# Patient Record
Sex: Female | Born: 1995
Health system: Southern US, Community
[De-identification: ages and names within clinical notes are randomized; demographics above are authoritative.]

## PROBLEM LIST (undated history)

## (undated) ENCOUNTER — Emergency Department (HOSPITAL_COMMUNITY): Admission: EM | Payer: 59

## (undated) DIAGNOSIS — K219 Gastro-esophageal reflux disease without esophagitis: Secondary | ICD-10-CM

## (undated) DIAGNOSIS — F419 Anxiety disorder, unspecified: Secondary | ICD-10-CM

## (undated) DIAGNOSIS — R7303 Prediabetes: Secondary | ICD-10-CM

## (undated) DIAGNOSIS — T7840XA Allergy, unspecified, initial encounter: Secondary | ICD-10-CM

## (undated) HISTORY — DX: Allergy, unspecified, initial encounter: T78.40XA

## (undated) HISTORY — PX: TONSILLECTOMY AND ADENOIDECTOMY: SHX28

## (undated) HISTORY — DX: Anxiety disorder, unspecified: F41.9

## (undated) HISTORY — PX: WISDOM TOOTH EXTRACTION: SHX21

---
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
## 2013-03-30 ENCOUNTER — Ambulatory Visit (INDEPENDENT_AMBULATORY_CARE_PROVIDER_SITE_OTHER): Payer: BC Managed Care – PPO | Admitting: *Deleted

## 2013-03-30 DIAGNOSIS — Z111 Encounter for screening for respiratory tuberculosis: Secondary | ICD-10-CM

## 2013-03-30 NOTE — Progress Notes (Signed)
Patient tolerated well.

## 2013-03-30 NOTE — Patient Instructions (Signed)
TB (Tuberculosis) Tine Test  The tuberculin tine test is used to determine whether someone has come in contact with the bacteria that cause a disease called tuberculosis.  HOW THE TEST IS DONE      This test uses a tiny spiked instrument to inject a small amount of the tuberculosis dead protein material (antigen) just under your skin. This is most commonly done on the arm. Usually, the area is marked with an ink pen. That way it can be checked for any redness and swelling. It is usually checked in 2 to 3 days.  Note: Another test, called the "tuberculin skin test" (also called a PPD), is more accurate than the TB tine test. It is the preferred method of determining exposure to tuberculosis.  PREPARATION FOR TEST      There is no special preparation. People with a skin rash or other skin irritations on their arms may need to have the test performed at a different spot on the body.  HOW THE TEST WILL FEEL      Some people feel a slight stinging sensation when the instrument is inserted under the skin. After the test, the area may itch or burn.  MEANING OF THE TEST      This test helps determine if you have ever been exposed to a disease called tuberculosis. If so, your immune system produced antibodies to help fight the disease. These remain in your body. When this test is performed, those with antibodies to tuberculosis will have a positive test result.  OBTAINING THE TEST RESULTS  The area needs to be checked for any redness and swelling at a later time, usually in 2 to 3 days. Follow your caregiver's instructions as to where and when to report for this to be done. It is your responsibility to obtain your test results. Ask the lab or department performing the test when and how you will get your results.  NORMAL FINDINGS     If you have a negative test result, the area may be a little red, but will not be swollen and firm like a mosquito bite. This means you have not been exposed to the bacteria that cause  tuberculosis.  WHAT ABNORMAL RESULTS MEAN      If you have been exposed to tuberculosis, the area will become red and swell like a mosquito bit in 48 to 72 hours. This is considered a positive test result. It means your body's immune system detected the substance injected under your skin. A positive TB tine test does not mean that you have active tuberculosis. It only means that you have been exposed at some point in the past.   A chest x-ray may be taken to evaluate whether you have active tuberculosis.  Once you have been exposed, all future TB tine tests will be positive. If you have a positive TB tine test, the Centers for Disease Control and Prevention (CDC) recommends that you have a TB skin test (PPD, see above), unless the first tine test had a blistering or very large reaction.  RISKS AND COMPLICATIONS     The risk of severe side effects is very low. In the unlikely event that a side effect occurs, typical ones include itching and hives. Rarely, the area may blister, or the area of swelling may become very large.  Tell your health care provider if you have any severe reactions.  CONSIDERATIONS      The test results may be incorrect (false negative). False negative means the test suggests you have not been exposed to tuberculosis, but you really have been.  This is more likely in the elderly   and in patients with weakened immune systems, such as:  · AIDS patients  · Cancer patients who receive chemotherapy  · Those who receive organ transplants  · Anyone taking high doses of prescription steroid medication  Document Released: 12/20/2006 Document Revised: 09/27/2011 Document Reviewed: 01/16/2007  ExitCare® Patient Information ©2014 [employer].

## 2013-04-02 ENCOUNTER — Encounter: Payer: Self-pay | Admitting: *Deleted

## 2013-04-02 LAB — TB SKIN TEST
Induration: 0 mm
TB Skin Test: NEGATIVE

## 2013-05-17 ENCOUNTER — Ambulatory Visit: Payer: BC Managed Care – PPO | Admitting: Family Medicine

## 2013-05-24 ENCOUNTER — Ambulatory Visit (INDEPENDENT_AMBULATORY_CARE_PROVIDER_SITE_OTHER): Payer: BC Managed Care – PPO | Admitting: Family Medicine

## 2013-05-24 ENCOUNTER — Encounter: Payer: Self-pay | Admitting: Family Medicine

## 2013-05-24 VITALS — BP 120/78 | HR 74 | Temp 99.7°F | Ht 63.0 in | Wt 202.2 lb

## 2013-05-24 DIAGNOSIS — IMO0001 Reserved for inherently not codable concepts without codable children: Secondary | ICD-10-CM

## 2013-05-24 DIAGNOSIS — J069 Acute upper respiratory infection, unspecified: Secondary | ICD-10-CM

## 2013-05-24 DIAGNOSIS — Z309 Encounter for contraceptive management, unspecified: Secondary | ICD-10-CM

## 2013-05-24 MED ORDER — NORGESTIM-ETH ESTRAD TRIPHASIC 0.18/0.215/0.25 MG-25 MCG PO TABS: 1.0000 | ORAL_TABLET | Freq: Every day | ORAL | Status: DC

## 2013-05-24 MED ORDER — AZITHROMYCIN 250 MG PO TABS: ORAL_TABLET | ORAL | Status: DC

## 2013-05-24 NOTE — Progress Notes (Signed)
  Subjective:    Patient ID: Jennet Maduro, female    DOB: [birth year], 17 y.o.   MRN: [id number]  HPI This 17 y.o. female presents for evaluation of wanting to get on oral bcp's and URI Sx's for over a week.   Review of Systems C/o Uri sx's for over a week.   No chest pain, SOB, HA, dizziness, vision change, N/V, diarrhea, constipation, dysuria, urinary urgency or frequency, myalgias, arthralgias or rash.  Objective:   Physical Exam Vital signs noted  Well developed well nourished female.  HEENT - Head atraumatic Normocephalic                Eyes - PERRLA, Conjuctiva - clear Sclera- Clear EOMI                Ears - EAC's Wnl TM's Wnl Gross Hearing WNL                Nose - Nares patent                 Throat - oropharanx wnl Respiratory - Lungs CTA bilateral Cardiac - RRR S1 and S2 without murmur GI - Abdomen soft Nontender and bowel sounds active x 4 Extremities - No edema. Neuro - Grossly intact.       Assessment & Plan:  Contraception - Plan: Norgestimate-Ethinyl Estradiol Triphasic (ORTHO TRI-CYCLEN LO) 0.18/0.215/0.25 MG-25 MCG tab  URI (upper respiratory infection) - Plan: azithromycin (ZITHROMAX) 250 MG tablet  Deatra Canter FNP

## 2013-05-24 NOTE — Patient Instructions (Signed)

## 2013-07-09 ENCOUNTER — Telehealth: Payer: Self-pay | Admitting: Family Medicine

## 2013-07-09 NOTE — Telephone Encounter (Signed)
Appt given for tomorrow

## 2013-07-10 ENCOUNTER — Encounter: Payer: Self-pay | Admitting: Nurse Practitioner

## 2013-07-10 ENCOUNTER — Ambulatory Visit (INDEPENDENT_AMBULATORY_CARE_PROVIDER_SITE_OTHER): Payer: BC Managed Care – PPO | Admitting: Nurse Practitioner

## 2013-07-10 VITALS — BP 131/86 | Temp 99.1°F | Ht 63.0 in | Wt 195.0 lb

## 2013-07-10 DIAGNOSIS — R509 Fever, unspecified: Secondary | ICD-10-CM

## 2013-07-10 DIAGNOSIS — J069 Acute upper respiratory infection, unspecified: Secondary | ICD-10-CM

## 2013-07-10 LAB — POCT INFLUENZA A/B: Influenza A, POC: NEGATIVE

## 2013-07-10 NOTE — Patient Instructions (Signed)

## 2013-07-10 NOTE — Progress Notes (Signed)
   Subjective:    Patient ID: Sonya Santiago, female    DOB: 1995/12/08, 17 y.o.   MRN: [id number]  HPI  Patient in c/o cough, achiness and fever that started last Sunday- Feeling a little better today taking tylenol OTC.    Review of Systems  Constitutional: Positive for fever and chills. Negative for appetite change.  HENT: Positive for congestion, rhinorrhea and sore throat. Negative for sinus pressure.   Respiratory: Positive for cough.   Cardiovascular: Negative.   Gastrointestinal: Negative.   Genitourinary: Negative.   Musculoskeletal: Negative.        Objective:   Physical Exam  Constitutional: She is oriented to person, place, and time. She appears well-developed and well-nourished.  HENT:  Right Ear: Hearing, tympanic membrane, external ear and ear canal normal.  Left Ear: Hearing, tympanic membrane, external ear and ear canal normal.  Nose: Mucosal edema and rhinorrhea present. Right sinus exhibits no maxillary sinus tenderness and no frontal sinus tenderness. Left sinus exhibits no maxillary sinus tenderness and no frontal sinus tenderness.  Mouth/Throat: Uvula is midline. Posterior oropharyngeal edema (mild) present.  Cardiovascular: Normal rate and normal heart sounds.   Pulmonary/Chest: Effort normal and breath sounds normal.  Neurological: She is alert and oriented to person, place, and time.  Psychiatric: She has a normal mood and affect. Her behavior is normal. Judgment and thought content normal.    BP 131/86  Temp(Src) 99.1 F (37.3 C) (Oral)  Ht 5\' 3"  (1.6 m)  Wt 195 lb (88.451 kg)  BMI 34.55 kg/m2 Results for orders placed in visit on 07/10/13  POCT INFLUENZA A/B      Result Value Range   Influenza A, POC Negative     Influenza B, POC Negative           Assessment & Plan:   1. Fever, unspecified   2. Viral URI    1. Take meds as prescribed 2. Use a cool mist humidifier especially during the winter months and when heat has  been humid. 3.  Use saline nose sprays frequently 4. Saline irrigations of the nose can be very helpful if done frequently.  * 4X daily for 1 week*  * Use of a nettie pot can be helpful with this. Follow directions with this* 5. Drink plenty of fluids 6. Keep thermostat turn down low 7.For any cough or congestion  Use plain Mucinex- regular strength or max strength is fine   * Children- consult with Pharmacist for dosing 8. For fever or aces or pains- take tylenol or ibuprofen appropriate for age and weight.  * for fevers greater than 101 orally you may alternate ibuprofen and tylenol every  3 hours.   Mary-Margaret Daphine Deutscher, FNP

## 2013-07-25 ENCOUNTER — Ambulatory Visit (INDEPENDENT_AMBULATORY_CARE_PROVIDER_SITE_OTHER): Payer: BC Managed Care – PPO | Admitting: Family Medicine

## 2013-07-25 ENCOUNTER — Encounter: Payer: Self-pay | Admitting: Family Medicine

## 2013-07-25 VITALS — BP 126/84 | HR 100 | Temp 98.7°F | Ht 63.0 in | Wt 193.4 lb

## 2013-07-25 DIAGNOSIS — F329 Major depressive disorder, single episode, unspecified: Secondary | ICD-10-CM

## 2013-07-25 DIAGNOSIS — F3289 Other specified depressive episodes: Secondary | ICD-10-CM

## 2013-07-25 DIAGNOSIS — F32A Depression, unspecified: Secondary | ICD-10-CM

## 2013-07-25 DIAGNOSIS — R5383 Other fatigue: Principal | ICD-10-CM

## 2013-07-25 DIAGNOSIS — R5381 Other malaise: Secondary | ICD-10-CM

## 2013-07-25 LAB — POCT CBC
Granulocyte percent: 70.6 %G (ref 37–80)
HCT, POC: 40.4 % (ref 37.7–47.9)
Hemoglobin: 12.7 g/dL (ref 12.2–16.2)
Lymph, poc: 2.2 (ref 0.6–3.4)
MCH, POC: 25.7 pg — AB (ref 27–31.2)
MCHC: 31.5 g/dL — AB (ref 31.8–35.4)
MCV: 81.5 fL (ref 80–97)
MPV: 8.2 fL (ref 0–99.8)
POC Granulocyte: 6.1 (ref 2–6.9)
POC LYMPH PERCENT: 24.9 %L (ref 10–50)
Platelet Count, POC: 330 10*3/uL (ref 142–424)
RBC: 5 M/uL (ref 4.04–5.48)
RDW, POC: 14.5 %
WBC: 8.7 10*3/uL (ref 4.6–10.2)

## 2013-07-25 MED ORDER — BUPROPION HCL ER (XL) 150 MG PO TB24: 150.0000 mg | ORAL_TABLET | Freq: Every day | ORAL | Status: DC

## 2013-07-25 NOTE — Progress Notes (Signed)
   Subjective:    Patient ID: Sonya Santiago, female    DOB: September 07, 1995, 18 y.o.   MRN: [id number]  HPI This 18 y.o. female presents for evaluation of anorexia and low appetite for 3-4 months. She states she has nausea and early satiety when she eats now.  She has been having Low energy.  She has been having depression problems.   Review of Systems No chest pain, SOB, HA, dizziness, vision change, N/V, diarrhea, constipation, dysuria, urinary urgency or frequency, myalgias, arthralgias or rash.     Objective:   Physical Exam  Vital signs noted  Well developed well nourished female.  HEENT - Head atraumatic Normocephalic                Eyes - PERRLA, Conjuctiva - clear Sclera- Clear EOMI                Ears - EAC's Wnl TM's Wnl Gross Hearing                 Throat - oropharanx wnl Respiratory - Lungs CTA bilateral Cardiac - RRR S1 and S2 without murmur GI - Abdomen soft Nontender and bowel sounds active x 4 Extremities - No edema. Neuro - Grossly intact. Psych - S (+) I (+) E (+) G(+) C (+) A (+) P (+) S (+) appetite (+)               Denies suicide or homicidal ideation.     Assessment & Plan:  Other malaise and fatigue - Plan: POCT CBC, CMP14+EGFR, Thyroid Panel With TSH, Vitamin B12, Vit D  25 hydroxy (rtn osteoporosis monitoring)  Depression - Plan: POCT CBC, CMP14+EGFR, Thyroid Panel With TSH, Vitamin B12, Vit D  25 hydroxy (rtn osteoporosis monitoring)  Lysbeth Penner FNP

## 2013-07-25 NOTE — Patient Instructions (Signed)

## 2013-07-26 ENCOUNTER — Other Ambulatory Visit: Payer: Self-pay | Admitting: Family Medicine

## 2013-07-26 LAB — CMP14+EGFR
ALT: 12 IU/L (ref 0–32)
AST: 15 IU/L (ref 0–40)
Albumin/Globulin Ratio: 1.7 (ref 1.1–2.5)
Albumin: 4.6 g/dL (ref 3.5–5.5)
Alkaline Phosphatase: 83 IU/L (ref 39–117)
BUN/Creatinine Ratio: 11 (ref 8–20)
BUN: 8 mg/dL (ref 6–20)
CO2: 21 mmol/L (ref 18–29)
Calcium: 9.9 mg/dL (ref 8.7–10.2)
Chloride: 99 mmol/L (ref 97–108)
Creatinine, Ser: 0.7 mg/dL (ref 0.57–1.00)
GFR calc Af Amer: 145 mL/min/{1.73_m2} (ref >59)
GFR calc non Af Amer: 126 mL/min/{1.73_m2} (ref >59)
Globulin, Total: 2.7 g/dL (ref 1.5–4.5)
Glucose: 93 mg/dL (ref 65–99)
Potassium: 4.6 mmol/L (ref 3.5–5.2)
Sodium: 140 mmol/L (ref 134–144)
Total Bilirubin: 0.4 mg/dL (ref 0.0–1.2)
Total Protein: 7.3 g/dL (ref 6.0–8.5)

## 2013-07-26 LAB — VITAMIN D 25 HYDROXY (VIT D DEFICIENCY, FRACTURES): Vit D, 25-Hydroxy: 20.6 ng/mL — ABNORMAL LOW (ref 30.0–100.0)

## 2013-07-26 LAB — VITAMIN B12: Vitamin B-12: 219 pg/mL (ref 211–946)

## 2013-07-26 LAB — THYROID PANEL WITH TSH
Free Thyroxine Index: 2.1 (ref 1.2–4.9)
T3 Uptake Ratio: 26 % (ref 24–39)
T4, Total: 8.2 ug/dL (ref 4.5–12.0)
TSH: 1.01 u[IU]/mL (ref 0.450–4.500)

## 2013-07-26 MED ORDER — VITAMIN D (ERGOCALCIFEROL) 1.25 MG (50000 UNIT) PO CAPS: 50000.0000 [IU] | ORAL_CAPSULE | ORAL | Status: DC

## 2013-07-26 MED ORDER — CYANOCOBALAMIN 1000 MCG/ML IJ SOLN: INTRAMUSCULAR | Status: DC

## 2013-08-27 ENCOUNTER — Encounter: Payer: Self-pay | Admitting: Family Medicine

## 2013-08-27 ENCOUNTER — Ambulatory Visit (INDEPENDENT_AMBULATORY_CARE_PROVIDER_SITE_OTHER): Payer: BC Managed Care – PPO | Admitting: Family Medicine

## 2013-08-27 VITALS — BP 112/71 | HR 68 | Temp 97.0°F | Ht 63.0 in | Wt 193.0 lb

## 2013-08-27 DIAGNOSIS — R5381 Other malaise: Secondary | ICD-10-CM

## 2013-08-27 DIAGNOSIS — J312 Chronic pharyngitis: Secondary | ICD-10-CM

## 2013-08-27 DIAGNOSIS — R5383 Other fatigue: Secondary | ICD-10-CM

## 2013-08-27 DIAGNOSIS — R0609 Other forms of dyspnea: Secondary | ICD-10-CM

## 2013-08-27 DIAGNOSIS — Z111 Encounter for screening for respiratory tuberculosis: Secondary | ICD-10-CM

## 2013-08-27 DIAGNOSIS — R0989 Other specified symptoms and signs involving the circulatory and respiratory systems: Secondary | ICD-10-CM

## 2013-08-27 DIAGNOSIS — R0683 Snoring: Secondary | ICD-10-CM

## 2013-08-27 DIAGNOSIS — Z23 Encounter for immunization: Secondary | ICD-10-CM

## 2013-08-27 DIAGNOSIS — Z0000 Encounter for general adult medical examination without abnormal findings: Secondary | ICD-10-CM

## 2013-08-27 LAB — POCT CBC
Granulocyte percent: 75.3 %G (ref 37–80)
HCT, POC: 36.4 % — AB (ref 37.7–47.9)
Hemoglobin: 11.3 g/dL — AB (ref 12.2–16.2)
Lymph, poc: 2 (ref 0.6–3.4)
MCH, POC: 25 pg — AB (ref 27–31.2)
MCHC: 31.1 g/dL — AB (ref 31.8–35.4)
MCV: 80.6 fL (ref 80–97)
MPV: 8.1 fL (ref 0–99.8)
POC Granulocyte: 6.9 (ref 2–6.9)
POC LYMPH PERCENT: 21.8 %L (ref 10–50)
Platelet Count, POC: 340 10*3/uL (ref 142–424)
RBC: 4.5 M/uL (ref 4.04–5.48)
RDW, POC: 14 %
WBC: 9.1 10*3/uL (ref 4.6–10.2)

## 2013-08-27 MED ORDER — NORGESTIM-ETH ESTRAD TRIPHASIC 0.18/0.215/0.25 MG-35 MCG PO TABS: 1.0000 | ORAL_TABLET | Freq: Every day | ORAL | Status: DC

## 2013-08-27 NOTE — Progress Notes (Signed)
   Subjective:    Patient ID: Sonya Santiago, female    DOB: 1995/08/16, 18 y.o.   MRN: [id number]  HPI This 18 y.o. female presents for evaluation of vitamin D deficiency, b12 deficiency, and depression And it is controlled with the wellbutrin.   She needs a TBST for work.  She gets strep throat frequently and she has enlarged tonsils.  She has snoring problems and she has insomnia.  She needs her ortho tri cyclen lo changed to ortho tricyclen since the prior is too expensive.   Review of Systems No chest pain, SOB, HA, dizziness, vision change, N/V, diarrhea, constipation, dysuria, urinary urgency or frequency, myalgias, arthralgias or rash.     Objective:   Physical Exam Vital signs noted  Well developed well nourished female.  HEENT - Head atraumatic Normocephalic                Eyes - PERRLA, Conjuctiva - clear Sclera- Clear EOMI                Ears - EAC's Wnl TM's Wnl Gross Hearing WNL                Nose - Nares patent                 Throat - oropharanx tonsils 3 plus Respiratory - Lungs CTA bilateral Cardiac - RRR S1 and S2 without murmur GI - Abdomen soft Nontender and bowel sounds active x 4 Extremities - No edema. Neuro - Grossly intact.       Assessment & Plan:  Need for tuberculosis vaccination - Plan: PPD, Vit D  25 hydroxy (rtn osteoporosis monitoring)  Chronic pharyngitis - Plan: Ambulatory referral to ENT  Snoring - Plan: Ambulatory referral to ENT  Other malaise and fatigue - Plan: Vit D  25 hydroxy (rtn osteoporosis monitoring), Vitamin B12, CMP14+EGFR, Lipid panel, TSH, POCT CBC, Ambulatory referral to ENT  Routine general medical examination at a health care facility - Plan: Vit D  25 hydroxy (rtn osteoporosis monitoring), Vitamin B12, CMP14+EGFR, Lipid panel, TSH, POCT CBC, Norgestimate-Ethinyl Estradiol Triphasic (ORTHO TRI-CYCLEN, 28,) 0.18/0.215/0.25 MG-35 MCG tablet  Lysbeth Penner FNP

## 2013-08-28 ENCOUNTER — Other Ambulatory Visit: Payer: Self-pay | Admitting: Family Medicine

## 2013-08-28 LAB — CMP14+EGFR
ALT: 15 IU/L (ref 0–32)
AST: 14 IU/L (ref 0–40)
Albumin/Globulin Ratio: 2 (ref 1.1–2.5)
Albumin: 4.5 g/dL (ref 3.5–5.5)
Alkaline Phosphatase: 77 IU/L (ref 39–117)
BUN/Creatinine Ratio: 13 (ref 8–20)
BUN: 8 mg/dL (ref 6–20)
CO2: 22 mmol/L (ref 18–29)
Calcium: 9.6 mg/dL (ref 8.7–10.2)
Chloride: 100 mmol/L (ref 97–108)
Creatinine, Ser: 0.63 mg/dL (ref 0.57–1.00)
GFR calc Af Amer: 150 mL/min/{1.73_m2} (ref >59)
GFR calc non Af Amer: 130 mL/min/{1.73_m2} (ref >59)
Globulin, Total: 2.3 g/dL (ref 1.5–4.5)
Glucose: 90 mg/dL (ref 65–99)
Potassium: 4.6 mmol/L (ref 3.5–5.2)
Sodium: 139 mmol/L (ref 134–144)
Total Bilirubin: 0.2 mg/dL (ref 0.0–1.2)
Total Protein: 6.8 g/dL (ref 6.0–8.5)

## 2013-08-28 LAB — VITAMIN D 25 HYDROXY (VIT D DEFICIENCY, FRACTURES): Vit D, 25-Hydroxy: 28 ng/mL — ABNORMAL LOW (ref 30.0–100.0)

## 2013-08-28 LAB — LIPID PANEL
Chol/HDL Ratio: 3.3 ratio units (ref 0.0–4.4)
Cholesterol, Total: 162 mg/dL (ref 100–169)
HDL: 49 mg/dL (ref >39)
LDL Calculated: 96 mg/dL (ref 0–109)
Triglycerides: 84 mg/dL (ref 0–89)
VLDL Cholesterol Cal: 17 mg/dL (ref 5–40)

## 2013-08-28 LAB — TSH: TSH: 1.55 u[IU]/mL (ref 0.450–4.500)

## 2013-08-28 LAB — VITAMIN B12: Vitamin B-12: 616 pg/mL (ref 211–946)

## 2013-08-28 MED ORDER — VITAMIN D (ERGOCALCIFEROL) 1.25 MG (50000 UNIT) PO CAPS: 50000.0000 [IU] | ORAL_CAPSULE | ORAL | Status: DC

## 2013-08-29 ENCOUNTER — Telehealth: Payer: Self-pay | Admitting: Family Medicine

## 2013-08-30 NOTE — Telephone Encounter (Signed)
Message copied by Almeta MonasSTONE, Seba Madole M on Thu Aug 30, 2013  4:21 PM ------      Message from: Deatra CanterXFORD, WILLIAM J      Created: Tue Aug 28, 2013  8:25 AM       Mild anemia and recommend taking otc iron for this.  Can take in MVI also.  Vit D a little low and vit D rx sent in to pharmacy ------

## 2013-08-30 NOTE — Telephone Encounter (Signed)
Aware of results. 

## 2013-08-30 NOTE — Telephone Encounter (Signed)
Cannot leave message.

## 2013-08-30 NOTE — Telephone Encounter (Signed)
No answer. Note on labs.

## 2013-09-12 ENCOUNTER — Other Ambulatory Visit: Payer: Self-pay | Admitting: *Deleted

## 2013-09-12 MED ORDER — BUPROPION HCL ER (XL) 150 MG PO TB24: 150.0000 mg | ORAL_TABLET | Freq: Every day | ORAL | Status: DC

## 2013-10-16 ENCOUNTER — Ambulatory Visit: Payer: BC Managed Care – PPO

## 2013-11-07 ENCOUNTER — Other Ambulatory Visit: Payer: Self-pay | Admitting: Otolaryngology

## 2014-02-12 ENCOUNTER — Other Ambulatory Visit: Payer: Self-pay | Admitting: Family Medicine

## 2014-08-14 ENCOUNTER — Other Ambulatory Visit: Payer: Self-pay | Admitting: Family Medicine

## 2015-04-08 ENCOUNTER — Telehealth: Payer: Self-pay | Admitting: Family Medicine

## 2015-04-08 ENCOUNTER — Encounter: Payer: Self-pay | Admitting: Family

## 2015-04-08 ENCOUNTER — Ambulatory Visit (INDEPENDENT_AMBULATORY_CARE_PROVIDER_SITE_OTHER): Payer: BLUE CROSS/BLUE SHIELD | Admitting: Family

## 2015-04-08 VITALS — BP 137/86 | HR 107 | Temp 99.0°F | Ht 63.0 in | Wt 220.0 lb

## 2015-04-08 DIAGNOSIS — R1032 Left lower quadrant pain: Secondary | ICD-10-CM

## 2015-04-08 DIAGNOSIS — R1030 Lower abdominal pain, unspecified: Secondary | ICD-10-CM

## 2015-04-08 DIAGNOSIS — R1011 Right upper quadrant pain: Secondary | ICD-10-CM | POA: Diagnosis not present

## 2015-04-08 DIAGNOSIS — N3000 Acute cystitis without hematuria: Secondary | ICD-10-CM | POA: Diagnosis not present

## 2015-04-08 LAB — POCT URINALYSIS DIPSTICK
Bilirubin, UA: NEGATIVE
GLUCOSE UA: NEGATIVE
Ketones, UA: NEGATIVE
LEUKOCYTES UA: NEGATIVE
NITRITE UA: NEGATIVE
Protein, UA: NEGATIVE
RBC UA: NEGATIVE
Spec Grav, UA: >=1.03
UROBILINOGEN UA: NEGATIVE
pH, UA: 5

## 2015-04-08 LAB — POCT UA - MICROSCOPIC ONLY
Casts, Ur, LPF, POC: NEGATIVE
Crystals, Ur, HPF, POC: NEGATIVE
Yeast, UA: NEGATIVE

## 2015-04-08 MED ORDER — SULFAMETHOXAZOLE-TRIMETHOPRIM 800-160 MG PO TABS: 1.0000 | ORAL_TABLET | Freq: Two times a day (BID) | ORAL | Status: DC

## 2015-04-08 NOTE — Progress Notes (Signed)
   Subjective:    Patient ID: Sonya Santiago, female    DOB: 1995-07-26, 19 y.o.   MRN: [id number]  Abdominal Pain This is a new problem. The current episode started in the past 7 days. The onset quality is gradual. The problem occurs constantly. The problem has been gradually worsening. The pain is located in the periumbilical region. The pain is at a severity of 5/10. The pain is moderate. The quality of the pain is aching. Associated symptoms include a fever, headaches and nausea. Pertinent negatives include no dysuria, frequency, hematuria or vomiting. The pain is aggravated by movement. The pain is relieved by being still. She has tried acetaminophen (AZO) for the symptoms. The treatment provided mild relief.  Fever  Associated symptoms include abdominal pain, headaches and nausea. Pertinent negatives include no urinary pain or vomiting.      Review of Systems  Constitutional: Positive for fever.  Eyes: Negative.   Respiratory: Negative.  Negative for shortness of breath.   Cardiovascular: Negative.  Negative for palpitations.  Gastrointestinal: Positive for nausea and abdominal pain. Negative for vomiting.  Endocrine: Negative.   Genitourinary: Negative.  Negative for dysuria, frequency and hematuria.  Musculoskeletal: Negative.   Neurological: Positive for headaches.  Hematological: Negative.   Psychiatric/Behavioral: Negative.   All other systems reviewed and are negative.      Objective:   Physical Exam  Constitutional: She is oriented to person, place, and time. She appears well-developed and well-nourished. No distress.  HENT:  Head: Normocephalic and atraumatic.  Eyes: Pupils are equal, round, and reactive to light.  Neck: Normal range of motion. Neck supple. No thyromegaly present.  Cardiovascular: Normal rate, regular rhythm, normal heart sounds and intact distal pulses.   No murmur heard. Pulmonary/Chest: Effort normal and breath sounds normal. No respiratory  distress. She has no wheezes.  Abdominal: Soft. Bowel sounds are normal. She exhibits no distension. There is tenderness (LLQ and RUQ pain).  Musculoskeletal: Normal range of motion. She exhibits no edema or tenderness.  Negative for CVA tenderness   Neurological: She is alert and oriented to person, place, and time. She has normal reflexes. No cranial nerve deficit.  Skin: Skin is warm and dry.  Psychiatric: She has a normal mood and affect. Her behavior is normal. Judgment and thought content normal.  Vitals reviewed.     BP 137/86 mmHg  Pulse 107  Temp(Src) 100.1 F (37.8 C) (Oral)  Ht  (1.6 m)  Wt 220 lb (99.791 kg)  BMI 38.98 kg/m2  LMP  (Approximate)     Assessment & Plan:  1. Lower abdominal pain - POCT urinalysis dipstick - POCT UA - Microscopic Only - CBC with Differential/Platelet  2. Acute cystitis without hematuria -Force fluids AZO over the counter X2 days RTO prn Culture pending - sulfamethoxazole-trimethoprim (BACTRIM DS,SEPTRA DS) 800-160 MG per tablet; Take 1 tablet by mouth 2 (two) times daily.  Dispense: 10 tablet; Refill: 0 - CBC with Differential/Platelet  3. RUQ abdominal pain - CBC with Differential/Platelet  4. LLQ abdominal pain - CBC with Differential/Platelet  Pt told if abd pain increases or fever increases to RTO or go to ED Discussed abd pain and fever could be related to gallbladder or appedix- Pt to follow up in 3 days Starting pt on antibiotic for UTI and CBC pending  Jannifer Rodney, FNP

## 2015-04-08 NOTE — Telephone Encounter (Signed)
Stp and pt given appt with Jannifer Rodney today at 4:40.

## 2015-04-08 NOTE — Patient Instructions (Addendum)
Abdominal Pain °Many things can cause abdominal pain. Usually, abdominal pain is not caused by a disease and will improve without treatment. It can often be observed and treated at home. Your health care provider will do a physical exam and possibly order blood tests and X-rays to help determine the seriousness of your pain. However, in many cases, more time must pass before a clear cause of the pain can be found. Before that point, your health care provider may not know if you need more testing or further treatment. °HOME CARE INSTRUCTIONS  °Monitor your abdominal pain for any changes. The following actions may help to alleviate any discomfort you are experiencing: °· Only take over-the-counter or prescription medicines as directed by your health care provider. °· Do not take laxatives unless directed to do so by your health care provider. °· Try a clear liquid diet (broth, tea, or water) as directed by your health care provider. Slowly move to a bland diet as tolerated. °SEEK MEDICAL CARE IF: °· You have unexplained abdominal pain. °· You have abdominal pain associated with nausea or diarrhea. °· You have pain when you urinate or have a bowel movement. °· You experience abdominal pain that wakes you in the night. °· You have abdominal pain that is worsened or improved by eating food. °· You have abdominal pain that is worsened with eating fatty foods. °· You have a fever. °SEEK IMMEDIATE MEDICAL CARE IF:  °· Your pain does not go away within 2 hours. °· You keep throwing up (vomiting). °· Your pain is felt only in portions of the abdomen, such as the right side or the left lower portion of the abdomen. °· You pass bloody or black tarry stools. °MAKE SURE YOU: °· Understand these instructions.   °· Will watch your condition.   °· Will get help right away if you are not doing well or get worse.   °Document Released: 04/14/2005 Document Revised: 07/10/2013 Document Reviewed: 03/14/2013 °[employer]® Patient Information  ©2015 [employer]. This information is not intended to replace advice given to you by your health care provider. Make sure you discuss any questions you have with your health care provider. °Urinary Tract Infection °Urinary tract infections (UTIs) can develop anywhere along your urinary tract. Your urinary tract is your body's drainage system for removing wastes and extra water. Your urinary tract includes two kidneys, two ureters, a bladder, and a urethra. Your kidneys are a pair of bean-shaped organs. Each kidney is about the size of your fist. They are located below your ribs, one on each side of your spine. °CAUSES °Infections are caused by microbes, which are microscopic organisms, including fungi, viruses, and bacteria. These organisms are so small that they can only be seen through a microscope. Bacteria are the microbes that most commonly cause UTIs. °SYMPTOMS  °Symptoms of UTIs may vary by age and gender of the patient and by the location of the infection. Symptoms in young women typically include a frequent and intense urge to urinate and a painful, burning feeling in the bladder or urethra during urination. Older women and men are more likely to be tired, shaky, and weak and have muscle aches and abdominal pain. A fever may mean the infection is in your kidneys. Other symptoms of a kidney infection include pain in your back or sides below the ribs, nausea, and vomiting. °DIAGNOSIS °To diagnose a UTI, your caregiver will ask you about your symptoms. Your caregiver also will ask to provide a urine sample. The urine sample   will be tested for bacteria and white blood cells. White blood cells are made by your body to help fight infection. °TREATMENT  °Typically, UTIs can be treated with medication. Because most UTIs are caused by a bacterial infection, they usually can be treated with the use of antibiotics. The choice of antibiotic and length of treatment depend on your symptoms and the type of bacteria  causing your infection. °HOME CARE INSTRUCTIONS °· If you were prescribed antibiotics, take them exactly as your caregiver instructs you. Finish the medication even if you feel better after you have only taken some of the medication. °· Drink enough water and fluids to keep your urine clear or pale yellow. °· Avoid caffeine, tea, and carbonated beverages. They tend to irritate your bladder. °· Empty your bladder often. Avoid holding urine for long periods of time. °· Empty your bladder before and after sexual intercourse. °· After a bowel movement, women should cleanse from front to back. Use each tissue only once. °SEEK MEDICAL CARE IF:  °· You have back pain. °· You develop a fever. °· Your symptoms do not begin to resolve within 3 days. °SEEK IMMEDIATE MEDICAL CARE IF:  °· You have severe back pain or lower abdominal pain. °· You develop chills. °· You have nausea or vomiting. °· You have continued burning or discomfort with urination. °MAKE SURE YOU:  °· Understand these instructions. °· Will watch your condition. °· Will get help right away if you are not doing well or get worse. °Document Released: 04/14/2005 Document Revised: 01/04/2012 Document Reviewed: 08/13/2011 °ExitCare® Patient Information ©2015 [employer]. This information is not intended to replace advice given to you by your health care provider. Make sure you discuss any questions you have with your health care provider. ° °

## 2015-04-09 ENCOUNTER — Telehealth: Payer: Self-pay | Admitting: Family Medicine

## 2015-04-09 ENCOUNTER — Other Ambulatory Visit: Payer: Self-pay | Admitting: Family

## 2015-04-09 ENCOUNTER — Other Ambulatory Visit: Payer: Self-pay

## 2015-04-09 DIAGNOSIS — R509 Fever, unspecified: Secondary | ICD-10-CM

## 2015-04-09 DIAGNOSIS — D72829 Elevated white blood cell count, unspecified: Secondary | ICD-10-CM

## 2015-04-09 DIAGNOSIS — R1011 Right upper quadrant pain: Secondary | ICD-10-CM

## 2015-04-09 DIAGNOSIS — R1032 Left lower quadrant pain: Secondary | ICD-10-CM

## 2015-04-09 LAB — CBC WITH DIFFERENTIAL/PLATELET
BASOS: 0 %
Basophils Absolute: 0 10*3/uL (ref 0.0–0.2)
EOS (ABSOLUTE): 0.2 10*3/uL (ref 0.0–0.4)
EOS: 1 %
HEMATOCRIT: 34.2 % (ref 34.0–46.6)
HEMOGLOBIN: 11.3 g/dL (ref 11.1–15.9)
IMMATURE GRANS (ABS): 0 10*3/uL (ref 0.0–0.1)
Immature Granulocytes: 0 %
LYMPHS: 22 %
Lymphocytes Absolute: 3.1 10*3/uL (ref 0.7–3.1)
MCH: 26.4 pg — ABNORMAL LOW (ref 26.6–33.0)
MCHC: 33 g/dL (ref 31.5–35.7)
MCV: 80 fL (ref 79–97)
MONOCYTES: 8 %
Monocytes Absolute: 1 10*3/uL — ABNORMAL HIGH (ref 0.1–0.9)
NEUTROS ABS: 9.3 10*3/uL — AB (ref 1.4–7.0)
NRBC: 0 % (ref 0–0)
Neutrophils: 69 %
Platelets: 440 10*3/uL — ABNORMAL HIGH (ref 150–379)
RBC: 4.28 x10E6/uL (ref 3.77–5.28)
RDW: 13.9 % (ref 12.3–15.4)
WBC: 13.7 10*3/uL — ABNORMAL HIGH (ref 3.4–10.8)

## 2015-04-10 LAB — URINE CULTURE

## 2015-04-11 ENCOUNTER — Ambulatory Visit: Payer: BLUE CROSS/BLUE SHIELD | Admitting: Physician Assistant

## 2015-04-11 ENCOUNTER — Ambulatory Visit (HOSPITAL_COMMUNITY)
Admission: RE | Admit: 2015-04-11 | Discharge: 2015-04-11 | Disposition: A | Discharge status: Home / Self Care | Payer: BLUE CROSS/BLUE SHIELD | Source: Ambulatory Visit | Attending: Family | Admitting: Family

## 2015-04-11 DIAGNOSIS — N8320 Unspecified ovarian cysts: Secondary | ICD-10-CM | POA: Diagnosis not present

## 2015-04-11 DIAGNOSIS — D72829 Elevated white blood cell count, unspecified: Secondary | ICD-10-CM | POA: Diagnosis not present

## 2015-04-11 DIAGNOSIS — R1084 Generalized abdominal pain: Secondary | ICD-10-CM | POA: Insufficient documentation

## 2015-04-11 DIAGNOSIS — R933 Abnormal findings on diagnostic imaging of other parts of digestive tract: Secondary | ICD-10-CM | POA: Insufficient documentation

## 2015-04-11 MED ORDER — IOHEXOL 300 MG/ML  SOLN
100.0000 mL | Freq: Once | INTRAMUSCULAR | Status: AC | PRN
  Administered 2015-04-11: 100 mL via INTRAVENOUS

## 2015-04-11 MED ORDER — SODIUM CHLORIDE 0.9 % IJ SOLN
INTRAMUSCULAR | Status: AC
  Filled 2015-04-11: qty 30

## 2015-04-11 MED ORDER — SODIUM CHLORIDE 0.9 % IJ SOLN
INTRAMUSCULAR | Status: AC
  Filled 2015-04-11: qty 500

## 2015-04-14 ENCOUNTER — Encounter: Payer: Self-pay | Admitting: Family Medicine

## 2015-05-27 ENCOUNTER — Telehealth: Payer: Self-pay | Admitting: Nurse Practitioner

## 2015-05-27 ENCOUNTER — Ambulatory Visit (INDEPENDENT_AMBULATORY_CARE_PROVIDER_SITE_OTHER): Payer: BLUE CROSS/BLUE SHIELD | Admitting: Family

## 2015-05-27 ENCOUNTER — Encounter: Payer: Self-pay | Admitting: Family

## 2015-05-27 VITALS — BP 130/88 | HR 119 | Temp 98.4°F | Ht 63.0 in | Wt 218.0 lb

## 2015-05-27 DIAGNOSIS — N912 Amenorrhea, unspecified: Secondary | ICD-10-CM | POA: Diagnosis not present

## 2015-05-27 DIAGNOSIS — N926 Irregular menstruation, unspecified: Secondary | ICD-10-CM

## 2015-05-27 LAB — POCT URINE PREGNANCY: Preg Test, Ur: NEGATIVE

## 2015-05-27 NOTE — Telephone Encounter (Signed)
Patient given an appointment for today at 3:55.

## 2015-05-27 NOTE — Addendum Note (Signed)
Addended by: Jannifer RodneyHAWKS, CHRISTY A on: 05/27/2015 04:29 PM   Modules accepted: Orders

## 2015-05-27 NOTE — Progress Notes (Signed)
   Subjective:    Patient ID: Sonya Santiago, female    DOB: 1995-10-17, 19 y.o.   MRN: [id number]  HPI Pt presents to the office today for amenorrhea for one week. Pt states her periods are always regular and if they are "off it is only a day or two". Pt states she has had 3 negative pregnancy tests at home. Pt states she is sexually active. Pt states she had a brown discharge on Sunday until Tuesday morning. Pt states she is have "menstral cramps like normal".    Review of Systems  Constitutional: Negative.   HENT: Negative.   Eyes: Negative.   Respiratory: Negative.  Negative for shortness of breath.   Cardiovascular: Negative.  Negative for palpitations.  Gastrointestinal: Negative.   Endocrine: Negative.   Genitourinary: Negative.   Musculoskeletal: Negative.   Neurological: Negative.  Negative for headaches.  Hematological: Negative.   Psychiatric/Behavioral: Negative.   All other systems reviewed and are negative.      Objective:   Physical Exam  Constitutional: She is oriented to person, place, and time. She appears well-developed and well-nourished. No distress.  HENT:  Head: Normocephalic and atraumatic.  Eyes: Pupils are equal, round, and reactive to light.  Neck: Normal range of motion. Neck supple. No thyromegaly present.  Cardiovascular: Normal rate, regular rhythm, normal heart sounds and intact distal pulses.   No murmur heard. Pulmonary/Chest: Effort normal and breath sounds normal. No respiratory distress. She has no wheezes.  Abdominal: Soft. Bowel sounds are normal. She exhibits no distension. There is no tenderness.  Musculoskeletal: Normal range of motion. She exhibits no edema or tenderness.  Neurological: She is alert and oriented to person, place, and time. She has normal reflexes. No cranial nerve deficit.  Skin: Skin is warm and dry.  Psychiatric: She has a normal mood and affect. Her behavior is normal. Judgment and thought content normal.  Vitals  reviewed.   BP 130/88 mmHg  Pulse 119  Temp(Src) 98.4 F (36.9 C) (Oral)  Ht _0  (1.6 m)  Wt 218 lb (98.884 kg)  BMI 38.63 kg/m2  LMP 04/21/2015       Assessment & Plan:  1. Missed menses - POCT urine pregnancy - CMP14+EGFR - Thyroid Panel With TSH  2. Amenorrhea - hCG, quantitative, pregnancy - CMP14+EGFR - Thyroid Panel With TSH   Safe sex discussed Continue all meds Labs pending Health Maintenance reviewed Diet and exercise encouraged RTO as needed  Evelina Dun, FNP

## 2015-05-27 NOTE — Addendum Note (Signed)
Addended by: Prescott GumLAND, Tayla Panozzo M on: 05/27/2015 04:32 PM   Modules accepted: Orders

## 2015-05-27 NOTE — Patient Instructions (Signed)
Primary Amenorrhea Primary amenorrhea is the absence of any menstrual flow in a female by the age of 15 years. An average age for the start of menstruation is the age of 12 years. Primary amenorrhea is not considered to have occurred until a female is older than 15 years and has never menstruated. This may occur with or without other signs of puberty. CAUSES  Some common causes of not menstruating include:  Chromosomal abnormality causing the ovaries to malfunction is the most common cause of primary amenorrhea.  Malnutrition.  Low blood sugar (hypoglycemia).  Polycystic ovary syndrome (cysts in the ovaries, not ovulating).  Absence of the vagina, uterus, or ovaries since birth (congenital).  Extreme obesity.  Cystic fibrosis.  Drastic weight loss from any cause.  Over-exercising (running, biking) causing loss of body fat.  Pituitary gland tumor in the brain.  Long-term (chronic) illnesses.  Cushing disease.  Thyroid disease (hypothyroidism, hyperthyroidism).  Part of the brain (hypothalamus) not functioning normally.  Premature ovarian failure. SYMPTOMS  No menstruation by age 15 years in normally developed females is the primary symptom. Other symptoms may include:  Discharge from the breasts.  Hot flashes.  Adult acne.  Facial or chest hair.  Headaches.  Impaired vision.  Recent stress.  Changes in weight, diet, or exercise patterns. DIAGNOSIS  Primary amenorrhea is diagnosed with the help of a medical history and a physical exam. Other tests that may be recommended include:  Blood tests to check for pregnancy, hormonal changes, a bleeding or thyroid disorder, low iron levels (anemia), or other problems.  Urine tests.  Specialized X-ray exams. TREATMENT  Treatment will depend on the cause. For example, some of the causes of primary amenorrhea, such as congenital absence of sex organs, will require surgery to correct. Others may respond to treatment  with medicine. SEEK MEDICAL CARE IF:  There has not been any menstrual flow by age 15 years.  Body maturation does not occur at a level typical of peers.  Pelvic area pain occurs.  There is unusual weight gain or hair growth.   This information is not intended to replace advice given to you by your health care provider. Make sure you discuss any questions you have with your health care provider.   Document Released: 07/05/2005 Document Revised: 07/26/2014 Document Reviewed: 02/14/2013 Elsevier Interactive Patient Education 2016 Elsevier Inc.  

## 2015-05-28 LAB — CMP14+EGFR
A/G RATIO: 1.7 (ref 1.1–2.5)
ALBUMIN: 4.4 g/dL (ref 3.5–5.5)
ALT: 24 IU/L (ref 0–32)
AST: 18 IU/L (ref 0–40)
Alkaline Phosphatase: 83 IU/L (ref 39–117)
BILIRUBIN TOTAL: 0.3 mg/dL (ref 0.0–1.2)
BUN / CREAT RATIO: 18 (ref 8–20)
BUN: 10 mg/dL (ref 6–20)
CALCIUM: 9.2 mg/dL (ref 8.7–10.2)
CHLORIDE: 100 mmol/L (ref 97–106)
CO2: 23 mmol/L (ref 18–29)
Creatinine, Ser: 0.56 mg/dL — ABNORMAL LOW (ref 0.57–1.00)
GFR, EST AFRICAN AMERICAN: 154 mL/min/{1.73_m2} (ref >59)
GFR, EST NON AFRICAN AMERICAN: 134 mL/min/{1.73_m2} (ref >59)
GLOBULIN, TOTAL: 2.6 g/dL (ref 1.5–4.5)
Glucose: 102 mg/dL — ABNORMAL HIGH (ref 65–99)
POTASSIUM: 3.7 mmol/L (ref 3.5–5.2)
SODIUM: 139 mmol/L (ref 136–144)
TOTAL PROTEIN: 7 g/dL (ref 6.0–8.5)

## 2015-05-28 LAB — THYROID PANEL WITH TSH
FREE THYROXINE INDEX: 2.1 (ref 1.2–4.9)
T3 Uptake Ratio: 26 % (ref 24–39)
T4 TOTAL: 8.1 ug/dL (ref 4.5–12.0)
TSH: 2.51 u[IU]/mL (ref 0.450–4.500)

## 2015-05-28 LAB — BETA HCG QUANT (REF LAB)

## 2016-08-09 ENCOUNTER — Ambulatory Visit (INDEPENDENT_AMBULATORY_CARE_PROVIDER_SITE_OTHER): Payer: BLUE CROSS/BLUE SHIELD | Admitting: Obstetrics and Gynecology

## 2016-08-09 ENCOUNTER — Encounter: Payer: Self-pay | Admitting: Obstetrics and Gynecology

## 2016-08-09 VITALS — BP 128/76 | HR 86 | Ht 63.0 in | Wt 231.8 lb

## 2016-08-09 DIAGNOSIS — N946 Dysmenorrhea, unspecified: Secondary | ICD-10-CM

## 2016-08-09 DIAGNOSIS — N926 Irregular menstruation, unspecified: Secondary | ICD-10-CM

## 2016-08-09 DIAGNOSIS — N970 Female infertility associated with anovulation: Secondary | ICD-10-CM

## 2016-08-09 DIAGNOSIS — R635 Abnormal weight gain: Secondary | ICD-10-CM

## 2016-08-09 NOTE — Progress Notes (Signed)
Patient ID: Sonya Santiago, female   DOB: 1995/08/25, 21 y.o.   MRN: [id number]   [facility]  @DATE @            Patient name: Sonya HubertDeanna Santiago MRN [id number]  Date of birth: 1995/08/25  CC & HPI:   Chief Complaint  Patient presents with  . Infertility     Sonya Santiago is a 21 y.o. female presenting today for infertility discussion. Pt states she has been attempting to conceive for 1.5 years with the same partner. This is her first attempt at conception. Her partner, 23 yo, does not have any children. Her menses began at age 21 and were regular. LMP 07/27/16-07/31/16. Pt states this period was painful, had 4-5 days of spotting leading into the period, and was light for 2 days. She reports her periods occur monthly, but do not occur on a strict schedule. She reports a recent 20 lb weight gain in the last 6 months, but no changes in periods aside from increased cramping. Pt states her partner has no issues with impotence. No h/o pelvic infection, STD. She has had <5 partners.   ROS:  ROS No complaints, discussion only   Pertinent History Reviewed:   Reviewed Medical         Past Medical History:  Diagnosis Date  . Allergy                               Surgical Hx:    Past Surgical History:  Procedure Laterality Date  . TONSILLECTOMY AND ADENOIDECTOMY    . WISDOM TOOTH EXTRACTION     Medications: Reviewed & Updated - see associated section                      No current outpatient prescriptions on file.   Social History: Reviewed -  reports that she quit smoking about 2 years ago. She started smoking about 2 years ago. She smoked 0.10 packs per day. She has never used smokeless tobacco.  Objective Findings:  Vitals: Blood pressure 128/76, pulse 86, height 5\' 3"  (1.6 m), weight 231 lb 12.8 oz (105.1 kg), last menstrual period 07/27/2016.  Physical Examination: Discussion only    Assessment & Plan:   A:  1. Suspect anovulation 2. Infertility x1year   P:  1.  Recommended [facility]  2. Obtain progesterone level, CBC, CMET, TSH on 08/17/16 3. Recommended [facility] for semen analysis  4. F/u for exam in 3w    By signing my name below, I, Doreatha MartinEva Mathews, attest that this documentation has been prepared under the direction and in the presence of Tilda BurrowJohn Meegan Shanafelt V, MD. Electronically Signed: Doreatha MartinEva Mathews, ED Scribe. 08/09/16. 2:13 PM.  I personally performed the services described in this documentation, which was SCRIBED in my presence. The recorded information has been reviewed and considered accurate. It has been edited as necessary during review. Tilda BurrowFERGUSON,Kaydince Towles V, MD

## 2016-08-18 LAB — COMPREHENSIVE METABOLIC PANEL
ALBUMIN: 4.3 g/dL (ref 3.5–5.5)
ALK PHOS: 87 IU/L (ref 39–117)
ALT: 28 IU/L (ref 0–32)
AST: 17 IU/L (ref 0–40)
Albumin/Globulin Ratio: 1.7 (ref 1.2–2.2)
BUN / CREAT RATIO: 13 (ref 9–23)
BUN: 8 mg/dL (ref 6–20)
Bilirubin Total: 0.2 mg/dL (ref 0.0–1.2)
CO2: 24 mmol/L (ref 18–29)
CREATININE: 0.63 mg/dL (ref 0.57–1.00)
Calcium: 9.3 mg/dL (ref 8.7–10.2)
Chloride: 102 mmol/L (ref 96–106)
GFR, EST AFRICAN AMERICAN: 147 mL/min/{1.73_m2} (ref >59)
GFR, EST NON AFRICAN AMERICAN: 128 mL/min/{1.73_m2} (ref >59)
GLOBULIN, TOTAL: 2.6 g/dL (ref 1.5–4.5)
Glucose: 106 mg/dL — ABNORMAL HIGH (ref 65–99)
Potassium: 4.3 mmol/L (ref 3.5–5.2)
SODIUM: 139 mmol/L (ref 134–144)
TOTAL PROTEIN: 6.9 g/dL (ref 6.0–8.5)

## 2016-08-18 LAB — CBC
HEMATOCRIT: 37.8 % (ref 34.0–46.6)
Hemoglobin: 11.7 g/dL (ref 11.1–15.9)
MCH: 25.6 pg — ABNORMAL LOW (ref 26.6–33.0)
MCHC: 31 g/dL — AB (ref 31.5–35.7)
MCV: 83 fL (ref 79–97)
Platelets: 388 10*3/uL — ABNORMAL HIGH (ref 150–379)
RBC: 4.57 x10E6/uL (ref 3.77–5.28)
RDW: 14.7 % (ref 12.3–15.4)
WBC: 9.6 10*3/uL (ref 3.4–10.8)

## 2016-08-18 LAB — PROGESTERONE: PROGESTERONE: 0.1 ng/mL

## 2016-08-18 LAB — TSH: TSH: 2.1 u[IU]/mL (ref 0.450–4.500)

## 2016-08-30 ENCOUNTER — Telehealth: Payer: Self-pay | Admitting: Obstetrics and Gynecology

## 2016-08-30 ENCOUNTER — Ambulatory Visit (INDEPENDENT_AMBULATORY_CARE_PROVIDER_SITE_OTHER): Payer: PRIVATE HEALTH INSURANCE | Admitting: Obstetrics and Gynecology

## 2016-08-30 ENCOUNTER — Encounter: Payer: Self-pay | Admitting: Obstetrics and Gynecology

## 2016-08-30 VITALS — BP 132/80 | HR 62 | Wt 230.6 lb

## 2016-08-30 DIAGNOSIS — N926 Irregular menstruation, unspecified: Secondary | ICD-10-CM | POA: Diagnosis not present

## 2016-08-30 DIAGNOSIS — F17210 Nicotine dependence, cigarettes, uncomplicated: Secondary | ICD-10-CM

## 2016-08-30 DIAGNOSIS — N970 Female infertility associated with anovulation: Secondary | ICD-10-CM | POA: Diagnosis not present

## 2016-08-30 MED ORDER — CLOMIPHENE CITRATE 50 MG PO TABS: 50.0000 mg | ORAL_TABLET | Freq: Every day | ORAL | Status: AC

## 2016-08-30 MED ORDER — CLOMIPHENE CITRATE 50 MG PO TABS: 50.0000 mg | ORAL_TABLET | Freq: Every day | ORAL | 2 refills | Status: DC

## 2016-08-30 NOTE — Telephone Encounter (Signed)
Patient called stating prescription for progesterone was not sent to pharmacy and also wanted to know if she was supposed to have a progesterone level drawn today? Please advise.

## 2016-08-30 NOTE — Progress Notes (Signed)
Patient ID: Sonya Santiago, female   DOB: March 05, 1996, 21 y.o.   MRN: [id number]   [facility] Clinic Visit  @DATE @            Patient name: Sonya Santiago MRN [id number]  Date of birth: 09-Feb-1996  CC & HPI:   Chief Complaint  Patient presents with  . Follow-up    anovoulation; exam today     Sonya Santiago is a 21 y.o. female presenting today for f/u of anovulation. Pt states she has not started measuring her temperature at home. LMP 07/27/16. Per pt, her periods have been chronically irregular since menarche. She typically has a period monthly, but they do not occur regularly.   ROS:  ROS Infertility discussion   Pertinent History Reviewed:   Reviewed Medical         Past Medical History:  Diagnosis Date  . Allergy                               Surgical Hx:    Past Surgical History:  Procedure Laterality Date  . TONSILLECTOMY AND ADENOIDECTOMY    . WISDOM TOOTH EXTRACTION     Medications: Reviewed & Updated - see associated section                      No current outpatient prescriptions on file.   Social History: Reviewed -  reports that she quit smoking about 2 years ago. She started smoking about 3 years ago. She smoked 0.10 packs per day. She has never used smokeless tobacco.  Objective Findings:  Vitals: Blood pressure 132/80, pulse 62, weight 230 lb 9.6 oz (104.6 kg), last menstrual period 07/27/2016.  Physical Examination: General appearance - alert, well appearing, and in no distress Mental status - alert, oriented to person, place, and time Abdomen - soft, nontender, nondistended, no masses or organomegaly Pelvic -  VULVA: normal appearing vulva with no masses, tenderness or lesions,  VAGINA: normal appearing vagina with normal color and discharge, no lesions,  CERVIX: normal appearing cervix without discharge or lesions,  UTERUS: uterus is normal size, shape, consistency and nontender,  ADNEXA: normal adnexa in size, nontender and no masses  Fern positive    Recent Results (from the past 2160 hour(s))  TSH     Status: None   Collection Time: 08/17/16  9:39 AM  Result Value Ref Range   TSH 2.100 0.450 - 4.500 uIU/mL  Comprehensive metabolic panel     Status: Abnormal   Collection Time: 08/17/16  9:39 AM  Result Value Ref Range   Glucose 106 (H) 65 - 99 mg/dL   BUN 8 6 - 20 mg/dL   Creatinine, Ser 7.82 0.57 - 1.00 mg/dL   GFR calc non Af Amer 128 >59 mL/min/1.73   GFR calc Af Amer 147 >59 mL/min/1.73   BUN/Creatinine Ratio 13 9 - 23   Sodium 139 134 - 144 mmol/L   Potassium 4.3 3.5 - 5.2 mmol/L   Chloride 102 96 - 106 mmol/L   CO2 24 18 - 29 mmol/L   Calcium 9.3 8.7 - 10.2 mg/dL   Total Protein 6.9 6.0 - 8.5 g/dL   Albumin 4.3 3.5 - 5.5 g/dL   Globulin, Total 2.6 1.5 - 4.5 g/dL   Albumin/Globulin Ratio 1.7 1.2 - 2.2   Bilirubin Total 0.2 0.0 - 1.2 mg/dL   Alkaline Phosphatase 87 39 - 117 IU/L   AST  17 0 - 40 IU/L   ALT 28 0 - 32 IU/L  Progesterone     Status: None   Collection Time: 08/17/16  9:39 AM  Result Value Ref Range   Progesterone 0.1 ng/mL    Comment:                      Follicular phase       0.1 -   0.9                      Luteal phase           1.8 -  23.9                      Ovulation phase        0.1 -  12.0                      Pregnant                         First trimester    11.0 -  44.3                         Second trimester   25.4 -  83.3                         Third trimester    58.7 - 214.0                      Postmenopausal         0.0 -   0.1   CBC     Status: Abnormal   Collection Time: 08/17/16  9:39 AM  Result Value Ref Range   WBC 9.6 3.4 - 10.8 x10E3/uL   RBC 4.57 3.77 - 5.28 x10E6/uL   Hemoglobin 11.7 11.1 - 15.9 g/dL   Hematocrit 16.137.8 09.634.0 - 46.6 %   MCV 83 79 - 97 fL   MCH 25.6 (L) 26.6 - 33.0 pg   MCHC 31.0 (L) 31.5 - 35.7 g/dL   RDW 04.514.7 40.912.3 - 81.115.4 %   Platelets 388 (H) 150 - 379 x10E3/uL      Assessment & Plan:   A:  1. Anovulation  2. Infertility x 1 year   P:   1. Rx Clomid  2. Recheck progesterone in 18 days. (collect on 09/17/16)     By signing my name below, I, Doreatha MartinEva Mathews, attest that this documentation has been prepared under the direction and in the presence of Tilda BurrowJohn Jaylin Benzel V, MD. Electronically Signed: Doreatha MartinEva Mathews, ED Scribe. 08/30/16. 2:30 PM.  I personally performed the services described in this documentation, which was SCRIBED in my presence. The recorded information has been reviewed and considered accurate. It has been edited as necessary during review. Tilda BurrowFERGUSON,Elmond Poehlman V, MD

## 2016-09-03 NOTE — Telephone Encounter (Signed)
Has Clomiphene Rx at pharmacy Should have Slip for Progesterone level on 09/17/16

## 2016-09-04 NOTE — Telephone Encounter (Signed)
Spoke with patient and stated she started her Clomid on Monday and took to Friday but started her period yesterday 2/16. She is wanting to know if she needs to start the Clomid over again. Please advise.

## 2016-09-07 ENCOUNTER — Telehealth: Payer: Self-pay | Admitting: *Deleted

## 2016-09-07 NOTE — Telephone Encounter (Signed)
Please call patient. She has some questions about Clomid. She started her period on 2/15 and doesn't know if she needs to "start over". You can leave a detailed message on her VM if necessary.

## 2016-09-07 NOTE — Telephone Encounter (Signed)
She started clomid then had period, no need to start over but have sex every other day day 7-24

## 2016-09-18 LAB — PROGESTERONE: PROGESTERONE: 1.3 ng/mL

## 2016-09-20 ENCOUNTER — Ambulatory Visit (INDEPENDENT_AMBULATORY_CARE_PROVIDER_SITE_OTHER): Payer: PRIVATE HEALTH INSURANCE | Admitting: Obstetrics and Gynecology

## 2016-09-20 ENCOUNTER — Encounter: Payer: Self-pay | Admitting: Obstetrics and Gynecology

## 2016-09-20 VITALS — BP 122/80 | Ht 63.0 in | Wt 232.0 lb

## 2016-09-20 DIAGNOSIS — N970 Female infertility associated with anovulation: Secondary | ICD-10-CM

## 2016-09-20 NOTE — Progress Notes (Signed)
Patient ID: Sonya HubertDeanna Santiago, female   DOB: [birth year], 21 y.o.   MRN: [id number]   [facility]  @[facility] @            Patient name: Sonya Santiago MRN [id number]  Date of birth: [birth year]  CC & HPI:   Chief Complaint  Patient presents with  . Follow-up    taking Clomid, + ovulation tests on Thurs and Fri (3/1 & 2)     Sonya Santiago is a 21 y.o. female presenting today for f/u of infertility. Pt was prescribed Clomid 08/30/16 and progesterone collected 09/17/16 was 1.3 as compared to 0.1 1 month ago. Pt states she took a positive ovulation test at home 09/16/16 or 09/17/16. She reports she was sexually active these days.   ROS:  ROS Infertility discussion   Pertinent History Reviewed:   Reviewed Medical         Past Medical History:  Diagnosis Date  . Allergy                               Surgical Hx:    Past Surgical History:  Procedure Laterality Date  . TONSILLECTOMY AND ADENOIDECTOMY    . WISDOM TOOTH EXTRACTION     Medications: Reviewed & Updated - see associated section                       Current Outpatient Prescriptions:  .  clomiPHENE (CLOMID) 50 MG tablet, Take 1 tablet (50 mg total) by mouth daily. X 5 days., Disp: 5 tablet, Rfl: 2   Social History: Reviewed -  reports that she quit smoking about 2 years ago. She started smoking about 3 years ago. She smoked 0.10 packs per day. She has never used smokeless tobacco.  Objective Findings:  Vitals: Blood pressure 122/80, height 5\' 3"  (1.6 m), weight 232 lb (105.2 kg), last menstrual period 09/02/2016.  Physical Examination: Discussion only    Assessment & Plan:   A:  1. Anovulation  Appears to have ovulated on clomid 2. Infertility x 1 year  3. Positive ovulation test at home.   P:  1. Check progesterone 09/23/16  2. Continue Clomid if menses occurs (start on day 3 of period)  3. F/u in 1 month     By signing my name below, I, Doreatha MartinEva Mathews, attest that this documentation has been prepared under the  direction and in the presence of Tilda BurrowJohn Ronelle Michie V, MD. Electronically Signed: Doreatha MartinEva Mathews, ED Scribe. 09/20/16. 1:53 PM.  I personally performed the services described in this documentation, which was SCRIBED in my presence. The recorded information has been reviewed and considered accurate. It has been edited as necessary during review. Tilda BurrowFERGUSON,Daijah Scrivens V, MD

## 2016-09-24 LAB — PROGESTERONE: Progesterone: 9.6 ng/mL

## 2016-10-20 ENCOUNTER — Ambulatory Visit: Payer: PRIVATE HEALTH INSURANCE | Admitting: Obstetrics and Gynecology

## 2018-03-15 ENCOUNTER — Encounter: Payer: Self-pay | Admitting: Advanced Practice Midwife

## 2018-03-15 ENCOUNTER — Ambulatory Visit (INDEPENDENT_AMBULATORY_CARE_PROVIDER_SITE_OTHER): Payer: BLUE CROSS/BLUE SHIELD | Admitting: Advanced Practice Midwife

## 2018-03-15 VITALS — BP 117/72 | HR 83 | Ht 63.0 in | Wt 242.0 lb

## 2018-03-15 DIAGNOSIS — N970 Female infertility associated with anovulation: Secondary | ICD-10-CM | POA: Diagnosis not present

## 2018-03-15 DIAGNOSIS — R102 Pelvic and perineal pain: Secondary | ICD-10-CM

## 2018-03-15 MED ORDER — CLOMIPHENE CITRATE 50 MG PO TABS: 50.0000 mg | ORAL_TABLET | Freq: Every day | ORAL | 2 refills | Status: DC

## 2018-03-15 MED ORDER — DICYCLOMINE HCL 20 MG PO TABS: 20.0000 mg | ORAL_TABLET | Freq: Four times a day (QID) | ORAL | 1 refills | Status: DC

## 2018-03-15 NOTE — Progress Notes (Signed)
[facility] Visit  Patient name: Sonya Santiago MRN [id number]  Date of birth: May 12, 1996  CC & HPI:  Sonya Santiago is a 22 y.o. Caucasian female presenting today for generalized mid to lower abdominal discomfort for about a week.  Feels "tight' and "pressure", esp after eating.  BMs normal.  Hx of irregular menses/anovulation, took clomid for a few months last years.  Still trying to get pregnant.  Wants to restart clomid. Still on PNV. Has had ov cysts, says also has pain sometimes that feels the same as that  Pertinent History Reviewed:  Medical & Surgical Hx:   Past Medical History:  Diagnosis Date  . Allergy    Past Surgical History:  Procedure Laterality Date  . TONSILLECTOMY AND ADENOIDECTOMY    . WISDOM TOOTH EXTRACTION     Family History  Problem Relation Age of Onset  . Cancer Mother   . Asthma Brother   . Cancer Maternal Grandmother   . Diabetes Maternal Grandfather   . Heart disease Maternal Grandfather   . Diabetes Paternal Grandmother   . Cirrhosis Paternal Grandfather     Current Outpatient Medications:  .  clomiPHENE (CLOMID) 50 MG tablet, Take 1 tablet (50 mg total) by mouth daily. X 5 days starting day 1 of next cycle., Disp: 5 tablet, Rfl: 2 .  dicyclomine (BENTYL) 20 MG tablet, Take 1 tablet (20 mg total) by mouth every 6 (six) hours., Disp: 30 tablet, Rfl: 1 Social History: Reviewed -  reports that she quit smoking about 3 years ago. She started smoking about 4 years ago. She smoked 0.10 packs per day. She has never used smokeless tobacco.  Review of Systems:   Constitutional: Negative for fever and chills Eyes: Negative for visual disturbances Respiratory: Negative for shortness of breath, dyspnea Cardiovascular: Negative for chest pain or palpitations  Gastrointestinal: Negative for vomiting, diarrhea and constipation; no abdominal pain Genitourinary: Negative for dysuria and urgency, vaginal irritation or itching Musculoskeletal: Negative for  back pain, joint pain, myalgias  Neurological: Negative for dizziness and headaches    Objective Findings:    Physical Examination: Vitals:   03/15/18 1457  BP: 117/72  Pulse: 83   General appearance - well appearing, and in no distress Mental status - alert, oriented to person, place, and time Chest:  Normal respiratory effort Heart - normal rate and regular rhythm Abdomen:  Soft, nontender Pelvic: nl appearing dc.  Non tender to bimanual except on left side Musculoskeletal:  Normal range of motion without pain Extremities:  No edema    No results found for this or any previous visit (from the past 24 hour(s)).    Assessment & Plan:  A:   ? GI source of abdominal pressure/pain  Vs GYN, leaning toward GI  Desired pregnancy w/hx of anovulation P:  Pelvic US  NuSwab  Bentyl/GasX  Clomid 50mg  day 1-5 next cycle (LMP 2 weeks ago)  Ov predictor sticks DAILY after day 5; continue until either a + ovulation or starts period   Return for pelvic US/gyn visit.  Jacklyn ShellFrances Cresenzo-Dishmon CNM 03/16/2018 9:43 AM

## 2018-03-18 LAB — NUSWAB VAGINITIS PLUS (VG+)
Candida albicans, NAA: NEGATIVE
Candida glabrata, NAA: NEGATIVE
Chlamydia trachomatis, NAA: NEGATIVE
NEISSERIA GONORRHOEAE, NAA: NEGATIVE
Trich vag by NAA: NEGATIVE

## 2018-03-28 ENCOUNTER — Other Ambulatory Visit (HOSPITAL_COMMUNITY): Payer: Self-pay | Admitting: Advanced Practice Midwife

## 2018-03-28 DIAGNOSIS — R102 Pelvic and perineal pain: Secondary | ICD-10-CM

## 2018-03-29 ENCOUNTER — Encounter: Payer: Self-pay | Admitting: Obstetrics and Gynecology

## 2018-03-29 ENCOUNTER — Other Ambulatory Visit: Payer: Self-pay

## 2018-03-29 ENCOUNTER — Ambulatory Visit (INDEPENDENT_AMBULATORY_CARE_PROVIDER_SITE_OTHER): Payer: BLUE CROSS/BLUE SHIELD

## 2018-03-29 ENCOUNTER — Ambulatory Visit: Payer: BLUE CROSS/BLUE SHIELD | Admitting: Obstetrics and Gynecology

## 2018-03-29 VITALS — BP 123/78 | HR 76 | Ht 63.0 in | Wt 248.0 lb

## 2018-03-29 DIAGNOSIS — R102 Pelvic and perineal pain: Secondary | ICD-10-CM

## 2018-03-29 DIAGNOSIS — N970 Female infertility associated with anovulation: Secondary | ICD-10-CM | POA: Diagnosis not present

## 2018-03-29 NOTE — Progress Notes (Signed)
PELVIC US TA/TV:homogeneous anteverted uterus wnl,normal ovaries bilat,ovaries appear mobile,EEC 9 mm,no free fluid,no pain during ultrasound

## 2018-03-29 NOTE — Progress Notes (Signed)
Patient ID: Sonya Santiago, female   DOB: [birth year], 22 y.o.   MRN: [id number]    [facility] Clinic Visit  @DATE @            Patient name: Sonya Santiago MRN [id number]  Date of birth: [birth year]  CC & HPI:  Sonya Santiago is a 22 y.o. female presenting today for transvaginal u/s. 1 ovulation in march and was on clomid. She will go a few months with regular periods towards the beginning of the month and past period was Aug 16 which is 2 weeks late. She started taking clomid again this month. ROS:  ROS All systems are negative except as noted in the HPI and PMH.  Pertinent History Reviewed:   Reviewed: Medical         Past Medical History:  Diagnosis Date  . Allergy                               Surgical Hx:    Past Surgical History:  Procedure Laterality Date  . TONSILLECTOMY AND ADENOIDECTOMY    . WISDOM TOOTH EXTRACTION     Medications: Reviewed & Updated - see associated section                       Current Outpatient Medications:  .  clomiPHENE (CLOMID) 50 MG tablet, Take 1 tablet (50 mg total) by mouth daily. X 5 days starting day 1 of next cycle. (Patient not taking: Reported on 03/29/2018), Disp: 5 tablet, Rfl: 2 .  dicyclomine (BENTYL) 20 MG tablet, Take 1 tablet (20 mg total) by mouth every 6 (six) hours. (Patient not taking: Reported on 03/29/2018), Disp: 30 tablet, Rfl: 1   Social History: Reviewed -  reports that she quit smoking about 3 years ago. She started smoking about 4 years ago. She smoked 0.10 packs per day. She has never used smokeless tobacco.  Objective Findings:  Vitals: Last menstrual period 03/07/2018.  PHYSICAL EXAMINATION General appearance - alert, well appearing, and in no distress and oriented to person, place, and time Mental status - alert, oriented to person, place, and time, normal mood, behavior, speech, dress, motor activity, and thought processes, affect appropriate to mood  PELVIC Had TV u/s: showed normal uterus, no pregancy  Assessment  & Plan:   A:  1.  anovulation  P:  1.   2. Day 3 of period take Clomid 50 qd x5d 3. F/u 24-26 days for gyn f/u 4. ccheck serum progesterone day 21 of next cycle    By signing my name below, I, Arnette Norris, attest that this documentation has been prepared under the direction and in the presence of Tilda Burrow, MD. Electronically Signed: Arnette Norris Medical Scribe. 03/29/18. 3:15 PM.  I personally performed the services described in this documentation, which was SCRIBED in my presence. The recorded information has been reviewed and considered accurate. It has been edited as necessary during review. Tilda Burrow, MD

## 2018-04-03 ENCOUNTER — Other Ambulatory Visit: Payer: BLUE CROSS/BLUE SHIELD

## 2018-04-06 ENCOUNTER — Telehealth: Payer: Self-pay | Admitting: Obstetrics and Gynecology

## 2018-04-06 NOTE — Telephone Encounter (Signed)
Responded via mychart message that patient sent.

## 2018-04-25 ENCOUNTER — Other Ambulatory Visit: Payer: Self-pay

## 2018-04-25 ENCOUNTER — Ambulatory Visit (INDEPENDENT_AMBULATORY_CARE_PROVIDER_SITE_OTHER): Payer: BLUE CROSS/BLUE SHIELD | Admitting: Physician Assistant

## 2018-04-25 ENCOUNTER — Encounter: Payer: Self-pay | Admitting: Physician Assistant

## 2018-04-25 VITALS — BP 130/80 | HR 90 | Temp 98.5°F | Resp 14 | Ht 63.5 in | Wt 243.0 lb

## 2018-04-25 DIAGNOSIS — Z23 Encounter for immunization: Secondary | ICD-10-CM

## 2018-04-25 DIAGNOSIS — Z111 Encounter for screening for respiratory tuberculosis: Secondary | ICD-10-CM | POA: Diagnosis not present

## 2018-04-25 DIAGNOSIS — F5101 Primary insomnia: Secondary | ICD-10-CM | POA: Diagnosis not present

## 2018-04-25 NOTE — Progress Notes (Signed)
Patient presents to clinic today to establish care. Patient endorses she and her husband are currently trying to conceive. Is followed by GYN in [place] who has her on Clomid currently to help with ovulation. Notes she has been having issue falling asleep. Does note feel anxious at bedtime but will lay awake several hours before falling asleep. Feels that when she finally falls asleep she stays asleep.   Patient needs PPD placement today for work. Denies history of TB or abnormal PPD test.  Health Maintenance: Immunizations -- Tetanus up-to-date. Agrees to flu shot today.  PAP --  Followed by [facility]. September 11th -- Normal per patient.   Past Medical History:  Diagnosis Date  . Allergy   . Anxiety     Past Surgical History:  Procedure Laterality Date  . TONSILLECTOMY AND ADENOIDECTOMY    . WISDOM TOOTH EXTRACTION      Current Outpatient Medications on File Prior to Visit  Medication Sig Dispense Refill  . clomiPHENE (CLOMID) 50 MG tablet Take 1 tablet (50 mg total) by mouth daily. X 5 days starting day 1 of next cycle. 5 tablet 2   No current facility-administered medications on file prior to visit.     No Known Allergies  Family History  Problem Relation Age of Onset  . Cancer Mother        Breast, Thyroid  . Asthma Brother   . Cancer Maternal Grandmother   . Diabetes Maternal Grandfather   . Heart disease Maternal Grandfather   . Diabetes Paternal Grandmother   . Cirrhosis Paternal Grandfather     Social History   Socioeconomic History  . Marital status: Married    Spouse name: Not on file  . Number of children: Not on file  . Years of education: Not on file  . Highest education level: Not on file  Occupational History  . Not on file  Social Needs  . Financial resource strain: Not on file  . Food insecurity:    Worry: Not on file    Inability: Not on file  . Transportation needs:    Medical: Not on file    Non-medical: Not on file    Tobacco Use  . Smoking status: Former Smoker    Packs/day: 0.10    Start date: 08/13/2013    Last attempt to quit: 07/12/2014    Years since quitting: 3.7  . Smokeless tobacco: Never Used  Substance and Sexual Activity  . Alcohol use: No    Alcohol/week: 0.0 standard drinks  . Drug use: No  . Sexual activity: Yes    Birth control/protection: None  Lifestyle  . Physical activity:    Days per week: Not on file    Minutes per session: Not on file  . Stress: Not on file  Relationships  . Social connections:    Talks on phone: Not on file    Gets together: Not on file    Attends religious service: Not on file    Active member of club or organization: Not on file    Attends meetings of clubs or organizations: Not on file    Relationship status: Not on file  . Intimate partner violence:    Fear of current or ex partner: Not on file    Emotionally abused: Not on file    Physically abused: Not on file    Forced sexual activity: Not on file  Other Topics Concern  . Not on file  Social History Narrative  .  Not on file   Review of Systems  Constitutional: Negative for fever and weight loss.  HENT: Negative for ear discharge, ear pain, hearing loss and tinnitus.   Eyes: Negative for blurred vision, double vision, photophobia and pain.  Respiratory: Negative for cough and shortness of breath.   Cardiovascular: Negative for chest pain and palpitations.  Gastrointestinal: Negative for abdominal pain, blood in stool, constipation, diarrhea, heartburn, melena, nausea and vomiting.  Genitourinary: Negative for dysuria, flank pain, frequency, hematuria and urgency.  Musculoskeletal: Negative for falls.  Neurological: Negative for dizziness, loss of consciousness and headaches.  Endo/Heme/Allergies: Negative for environmental allergies.  Psychiatric/Behavioral: Negative for depression, hallucinations, substance abuse and suicidal ideas. The patient has insomnia. The patient is not  nervous/anxious.    BP 130/80   Pulse 90   Temp 98.5 F (36.9 C) (Oral)   Resp 14   Ht 5' 3.5" (1.613 m)   Wt 243 lb (110.2 kg)   SpO2 98%   BMI 42.37 kg/m   Physical Exam  Constitutional: She is oriented to person, place, and time. She appears well-developed and well-nourished.  HENT:  Head: Normocephalic and atraumatic.  Eyes: Conjunctivae are normal.  Neck: Neck supple.  Cardiovascular: Normal rate, regular rhythm, normal heart sounds and intact distal pulses.  Pulmonary/Chest: Effort normal.  Neurological: She is alert and oriented to person, place, and time.  Psychiatric: She has a normal mood and affect.  Vitals reviewed.   Recent Results (from the past 2160 hour(s))  NuSwab Vaginitis Plus (VG+)     Status: None   Collection Time: 03/15/18  4:00 PM  Result Value Ref Range   Atopobium vaginae Low - 0 Score   BVAB 2 Low - 0 Score   Megasphaera 1 Low - 0 Score    Comment: Calculate total score by adding the 3 individual bacterial vaginosis (BV) marker scores together.  Total score is interpreted as follows: Total score 0-1: Indicates the absence of BV. Total score   2: Indeterminate for BV. Additional clinical                  data should be evaluated to establish a                  diagnosis. Total score 3-6: Indicates the presence of BV. This test was developed and its performance characteristics determined by [facility].  It has not been cleared or approved by the Food and Drug Administration.  The FDA has determined that such clearance or approval is not necessary.    Candida albicans, NAA Negative Negative   Candida glabrata, NAA Negative Negative    Comment: This test was developed and its performance characteristics determined by [facility].  It has not been cleared or approved by the Food and Drug Administration.  The FDA has determined that such clearance or approval is not necessary.    Trich vag by NAA Negative Negative   Chlamydia trachomatis, NAA  Negative Negative   Neisseria gonorrhoeae, NAA Negative Negative    Assessment/Plan: 1. Need for immunization against influenza Flu shot updated today. - Flu Vaccine QUAD 36+ mos IM  2. Need for tuberculosis vaccination TB skin test placed. She is to return Friday morning for read.  - TB Skin Test  3. Primary insomnia Patient trying to conceive. Want to avoid any Rx medications. Sleep hygiene practices reviewed and handout given. Can start OTC Pure ZZZs. Will follow.   Piedad Climes, PA-C

## 2018-04-25 NOTE — Patient Instructions (Signed)
It was nice meeting you today! Welcome to [facility]!  Please return Friday morning for PPD skin test read (TB test).  Try to start the Pure ZZZs OTC to help with sleep. Start the sleep hygiene measures below. Try to download the Calm app to do the relaxation exercises each morning and evening.  If things are not improving, let me know. We need to be very judicious with medications since you are actively trying to conceive.  Sleep Hygiene  Do: (1) Go to bed at the same time each day. (2) Get up from bed at the same time each day. (3) Get regular exercise each day, preferably in the morning.  There is goof evidence that regular exercise improves restful sleep.  This includes stretching and aerobic exercise. (4) Get regular exposure to outdoor or bright lights, especially in the late afternoon. (5) Keep the temperature in your bedroom comfortable. (6) Keep the bedroom quiet when sleeping. (7) Keep the bedroom dark enough to facilitate sleep. (8) Use your bed only for sleep and sex. (9) Take medications as directed.  It is helpful to take prescribed sleeping pills 1 hour before bedtime, so they are causing drowsiness when you lie down, or 10 hours before getting up, to avoid daytime drowsiness. (10) Use a relaxation exercise just before going to sleep -- imagery, massage, warm bath. (11) Keep your feet and hands warm.  Wear warm socks and/or mittens or gloves to bed.  Don't: (1) Exercise just before going to bed. (2) Engage in stimulating activity just before bed, such as playing a competitive game, watching an exciting program on television, or having an important discussion with a loved one. (3) Have caffeine in the evening (coffee, teas, chocolate, sodas, etc.) (4) Read or watch television in bed. (5) Use alcohol to help you sleep. (6) Go to bed too hungry or too full. (7) Take another person's sleeping pills. (8) Take over-the-counter sleeping pills, without your doctor's knowledge.   Tolerance can develop rapidly with these medications.  Diphenhydramine can have serious side effects for elderly patients. (9) Take daytime naps. (10) Command yourself to go to sleep.  This only makes your mind and body more alert.  If you lie awake for more than 20-30 minutes, get up, go to a different room, participate in a quiet activity (Ex - non-excitable reading or television), and then return to bed when you feel sleepy.  Do this as many times during the night as needed.  This may cause you to have a night or two of poor sleep but it will train your brain to know when it is time for sleep.

## 2018-04-28 ENCOUNTER — Ambulatory Visit: Payer: BLUE CROSS/BLUE SHIELD | Admitting: Obstetrics and Gynecology

## 2018-04-28 ENCOUNTER — Encounter: Payer: Self-pay | Admitting: Emergency Medicine

## 2018-04-28 ENCOUNTER — Encounter: Payer: Self-pay | Admitting: Obstetrics and Gynecology

## 2018-04-28 VITALS — BP 130/84 | HR 85 | Ht 63.0 in | Wt 242.4 lb

## 2018-04-28 DIAGNOSIS — N970 Female infertility associated with anovulation: Secondary | ICD-10-CM | POA: Diagnosis not present

## 2018-04-28 LAB — TB SKIN TEST
INDURATION: 0 mm
TB Skin Test: NEGATIVE

## 2018-04-28 NOTE — Progress Notes (Signed)
Patient ID: Sonya Santiago, female   DOB: 1996-03-29, 22 y.o.   MRN: [id number]    [facility] Clinic Visit  @DATE @            Patient name: Sonya Santiago MRN [id number]  Date of birth: [birth year]  CC & HPI:  Sonya Santiago is a 22 y.o. female presenting today for gyn f/u.  For chronic anovulation and subsequent infertility TV ultrasound came back normal other than a question of by endocervical polyp.  Exam today shows that was no polyp visible on cervical exam. She had complaints of lower abdominal pain. Today she reports not having any discomfort or tenderness. Pt says she has not had any pain for recently either. LMP Sep 29th and started Clomid on day 3 of period. Have not done testing to confirm ovulation  ROS:  ROS -Lower abdominal pain -fever -chills All systems are negative except as noted in the HPI and PMH.   Pertinent History Reviewed:   Reviewed: Medical         Past Medical History:  Diagnosis Date  . Allergy   . Anxiety                               Surgical Hx:    Past Surgical History:  Procedure Laterality Date  . TONSILLECTOMY AND ADENOIDECTOMY    . WISDOM TOOTH EXTRACTION     Medications: Reviewed & Updated - see associated section                       Current Outpatient Medications:  .  clomiPHENE (CLOMID) 50 MG tablet, Take 1 tablet (50 mg total) by mouth daily. X 5 days starting day 1 of next cycle., Disp: 5 tablet, Rfl: 2   Social History: Reviewed -  reports that she quit smoking about 3 years ago. She started smoking about 4 years ago. She smoked 0.10 packs per day. She has never used smokeless tobacco.  Objective Findings:  Vitals: There were no vitals taken for this visit.  PHYSICAL EXAMINATION General appearance - alert, well appearing, and in no distress and oriented to person, place, and time Mental status - alert, oriented to person, place, and time, normal mood, behavior, speech, dress, motor activity, and thought processes, affect  appropriate to mood  PELVIC Vagina - normal Cervix - tiny multips Uterus - as per u/s tiny, anterior, normal    Discussion: 1. Discussed with pt benefits of ovulation methods. Tracking methods with apps on phone and increasing medication.  At end of discussion, pt had opportunity to ask questions and has no further questions at this time.   Specific discussion of ovulation methods as noted above. Greater than 50% was spent in counseling and coordination of care with the patient.   Total time greater than: 15 minutes.    Assessment & Plan:   A:  1.  anovulation 2. No evidence endocervical polyp  P:  1. Restart Clomid again this month, will increase Rx Clomid 50 to 100 mg if patient remains anovulatory 2. Check serum progesterone cycle day 21 (05/08/2018) with follow-up by phone 3. Gyn f/u 3 months    By signing my name below, I, Arnette Norris, attest that this documentation has been prepared under the direction and in the presence of Tilda Burrow, MD. Electronically Signed: Arnette Norris Medical Scribe. 04/28/18. 9:02 AM.  I personally performed the services described in this  documentation, which was SCRIBED in my presence. The recorded information has been reviewed and considered accurate. It has been edited as necessary during review. Jonnie Kind, MD

## 2018-05-02 ENCOUNTER — Ambulatory Visit: Payer: BLUE CROSS/BLUE SHIELD | Admitting: Obstetrics and Gynecology

## 2018-05-08 ENCOUNTER — Other Ambulatory Visit: Payer: Self-pay | Admitting: *Deleted

## 2018-05-08 DIAGNOSIS — N970 Female infertility associated with anovulation: Secondary | ICD-10-CM

## 2018-05-09 ENCOUNTER — Telehealth: Payer: Self-pay | Admitting: Obstetrics and Gynecology

## 2018-05-09 LAB — PROGESTERONE: Progesterone: 3.9 ng/mL

## 2018-05-09 NOTE — Telephone Encounter (Deleted)
Left message that pt is to check MyChart for lab results.  Pt apparently ovulated on current dose Clomid. Low levels of progesterone, 3, but adequate to indicate ovulation. Will stay on %0 mg / d x days 3-7 each cycle and check progesterone levels on cycle day 21-23. 

## 2018-05-09 NOTE — Telephone Encounter (Signed)
Left message that pt is to check MyChart for lab results.  Pt apparently ovulated on current dose Clomid. Low levels of progesterone, 3, but adequate to indicate ovulation. Will stay on %0 mg / d x days 3-7 each cycle and check progesterone levels on cycle day 21-23.

## 2018-06-08 ENCOUNTER — Telehealth: Payer: Self-pay | Admitting: Obstetrics and Gynecology

## 2018-06-08 NOTE — Telephone Encounter (Signed)
LMOVM that patient was not on lab schedule and order is not standing.  Advised to let us know in the future that she is coming and we will place on lab schedule.

## 2018-06-08 NOTE — Telephone Encounter (Signed)
Patient called stating that she wasn't able to get the lab work done to check her Progesterone because we didn't have the lab order in and she had to go to work. Pt states that she know she is Ovulating because she took an Ovulation test and it was positive. She Just wanted to let Dr. Emelda FearFerguson know.

## 2018-07-13 ENCOUNTER — Other Ambulatory Visit: Payer: Self-pay

## 2018-07-13 ENCOUNTER — Emergency Department (HOSPITAL_COMMUNITY)
Admission: EM | Admit: 2018-07-13 | Discharge: 2018-07-13 | Disposition: A | Discharge status: Home / Self Care | Payer: BLUE CROSS/BLUE SHIELD | Attending: Emergency Medicine | Admitting: Emergency Medicine

## 2018-07-13 ENCOUNTER — Encounter (HOSPITAL_COMMUNITY): Payer: Self-pay

## 2018-07-13 ENCOUNTER — Emergency Department (HOSPITAL_COMMUNITY): Payer: BLUE CROSS/BLUE SHIELD

## 2018-07-13 DIAGNOSIS — Z79899 Other long term (current) drug therapy: Secondary | ICD-10-CM | POA: Diagnosis not present

## 2018-07-13 DIAGNOSIS — R0609 Other forms of dyspnea: Secondary | ICD-10-CM | POA: Insufficient documentation

## 2018-07-13 DIAGNOSIS — Z87891 Personal history of nicotine dependence: Secondary | ICD-10-CM | POA: Insufficient documentation

## 2018-07-13 LAB — CBC
HCT: 38.8 % (ref 36.0–46.0)
Hemoglobin: 12 g/dL (ref 12.0–15.0)
MCH: 24.7 pg — ABNORMAL LOW (ref 26.0–34.0)
MCHC: 30.9 g/dL (ref 30.0–36.0)
MCV: 79.8 fL — ABNORMAL LOW (ref 80.0–100.0)
Platelets: 406 10*3/uL — ABNORMAL HIGH (ref 150–400)
RBC: 4.86 MIL/uL (ref 3.87–5.11)
RDW: 15.2 % (ref 11.5–15.5)
WBC: 11.8 10*3/uL — ABNORMAL HIGH (ref 4.0–10.5)
nRBC: 0 % (ref 0.0–0.2)

## 2018-07-13 LAB — I-STAT CHEM 8, ED
BUN: 7 mg/dL (ref 6–20)
Calcium, Ion: 1.17 mmol/L (ref 1.15–1.40)
Chloride: 110 mmol/L (ref 98–111)
Creatinine, Ser: 0.4 mg/dL — ABNORMAL LOW (ref 0.44–1.00)
Glucose, Bld: 128 mg/dL — ABNORMAL HIGH (ref 70–99)
HCT: 35 % — ABNORMAL LOW (ref 36.0–46.0)
Hemoglobin: 11.9 g/dL — ABNORMAL LOW (ref 12.0–15.0)
Potassium: 3.9 mmol/L (ref 3.5–5.1)
Sodium: 138 mmol/L (ref 135–145)
TCO2: 20 mmol/L — ABNORMAL LOW (ref 22–32)

## 2018-07-13 LAB — I-STAT TROPONIN, ED
Troponin i, poc: 0 ng/mL (ref 0.00–0.08)
Troponin i, poc: 0 ng/mL (ref 0.00–0.08)

## 2018-07-13 LAB — COMPREHENSIVE METABOLIC PANEL
ALT: 33 U/L (ref 0–44)
AST: 24 U/L (ref 15–41)
Albumin: 3.8 g/dL (ref 3.5–5.0)
Alkaline Phosphatase: 68 U/L (ref 38–126)
Anion gap: 11 (ref 5–15)
BILIRUBIN TOTAL: 0.7 mg/dL (ref 0.3–1.2)
BUN: 8 mg/dL (ref 6–20)
CO2: 18 mmol/L — ABNORMAL LOW (ref 22–32)
Calcium: 9.2 mg/dL (ref 8.9–10.3)
Chloride: 109 mmol/L (ref 98–111)
Creatinine, Ser: 0.63 mg/dL (ref 0.44–1.00)
GFR calc Af Amer: >60 mL/min (ref >60)
GFR calc non Af Amer: >60 mL/min (ref >60)
Glucose, Bld: 120 mg/dL — ABNORMAL HIGH (ref 70–99)
Potassium: 4 mmol/L (ref 3.5–5.1)
Sodium: 138 mmol/L (ref 135–145)
TOTAL PROTEIN: 7.1 g/dL (ref 6.5–8.1)

## 2018-07-13 LAB — I-STAT BETA HCG BLOOD, ED (MC, WL, AP ONLY): I-stat hCG, quantitative: <5 m[IU]/mL (ref <5)

## 2018-07-13 LAB — BRAIN NATRIURETIC PEPTIDE: B NATRIURETIC PEPTIDE 5: 14.7 pg/mL (ref 0.0–100.0)

## 2018-07-13 LAB — D-DIMER, QUANTITATIVE: D-Dimer, Quant: <0.27 ug/mL-FEU (ref 0.00–0.50)

## 2018-07-13 NOTE — Discharge Instructions (Addendum)
Your work-up today was negative for any emergency cause of your shortness of breath.  Her oxygen saturation status has been normal throughout your visit.  Though it appears that you are having symptoms of exertional shortness of breath, there does not appear to be any life-threatening cause at this time.  You will need to follow-up with your primary care physician who should schedule an outpatient echocardiogram and potentially a stress test with the cardiologist.  I have also given you the number to cardiology see that you may take make your own appointment. Get help right away if: Your shortness of breath gets worse. You have shortness of breath when you are resting. You feel light-headed or you faint. You have a cough that is not controlled with medicines. You cough up blood. You have pain with breathing. You have pain in your chest, arms, shoulders, or abdomen. You have a fever. You cannot walk up stairs or exercise the way that you normally do.

## 2018-07-13 NOTE — ED Triage Notes (Addendum)
Per pt: She woke up around 6:30 am and was short of breath. States that she was just trying to go to the bathroom and "just felt like I couldn't breathe". Pt states that this has never happened before. No meds PTA. Lungs clear to ausculation. Pts color is appropriate. Skin is warm and dry. Pt states that this feels different from her anxiety.

## 2018-07-13 NOTE — ED Notes (Signed)
Patient verbalizes understanding of discharge instructions. Opportunity for questioning and answers were provided. Armband removed by staff, pt discharged from ED ambulatory.   

## 2018-07-13 NOTE — ED Notes (Signed)
Ambulated pt in hallway. O2 sats were ranging between 100 and 92 at lowest. Pt did show signs of sob. Respirations were up to 26.

## 2018-07-13 NOTE — ED Provider Notes (Signed)
[facility] Provider Note   CSN: [id number] Arrival date & time: 07/13/18  [id number]     History   Chief Complaint Chief Complaint  Patient presents with  . Shortness of Breath    HPI Sonya Santiago is a 22 y.o. female.  With a past medical history of anxiety and panic attack.  Patient presents with chief complaint of chest pain and shortness of breath.  Patient states that this morning she got up to go the bathroom and when she sat down she felt markedly short of breath.  She states "I just could not breathe."  She has had previous anxiety attacks in the past.  States that this felt different.  Patient recently took Clomid but does not take any other estrogen medications.  She denies smoking, history of DVT or PE, unilateral leg swelling, recent confinement of surgery, or family history of PE or DVT.  She denies fever or chills  HPI  Past Medical History:  Diagnosis Date  . Allergy   . Anxiety     Patient Active Problem List   Diagnosis Date Noted  . Infertility, anovulation 08/30/2016  . Anovulation 08/09/2016    Past Surgical History:  Procedure Laterality Date  . TONSILLECTOMY AND ADENOIDECTOMY    . WISDOM TOOTH EXTRACTION       OB History    Gravida  0   Para  0   Term  0   Preterm  0   AB  0   Living  0     SAB  0   TAB  0   Ectopic  0   Multiple  0   Live Births  0            Home Medications    Prior to Admission medications   Medication Sig Start Date End Date Taking? Authorizing Provider  clomiPHENE (CLOMID) 50 MG tablet Take 1 tablet (50 mg total) by mouth daily. X 5 days starting day 1 of next cycle. 03/15/18   Jacklyn Shellresenzo-Dishmon, Frances, CNM    Family History Family History  Problem Relation Age of Onset  . Cancer Mother        Breast, Thyroid  . Asthma Brother   . Cancer Maternal Grandmother   . Diabetes Maternal Grandfather   . Heart disease Maternal Grandfather   . Diabetes Paternal  Grandmother   . Cirrhosis Paternal Grandfather     Social History Social History   Tobacco Use  . Smoking status: Former Smoker    Packs/day: 0.10    Start date: 08/13/2013    Last attempt to quit: 07/12/2014    Years since quitting: 4.0  . Smokeless tobacco: Never Used  Substance Use Topics  . Alcohol use: No    Alcohol/week: 0.0 standard drinks  . Drug use: No     Allergies   Patient has no known allergies.   Review of Systems Review of Systems  Ten systems reviewed and are negative for acute change, except as noted in the HPI.   Physical Exam Updated Vital Signs BP 123/83   Pulse 96   Temp 98.1 F (36.7 C) (Oral)   Resp 18   SpO2 100%   Physical Exam Vitals signs and nursing note reviewed.  Constitutional:      General: She is not in acute distress.    Appearance: She is well-developed. She is not diaphoretic.  HENT:     Head: Normocephalic and atraumatic.  Eyes:  General: No scleral icterus.    Conjunctiva/sclera: Conjunctivae normal.  Neck:     Musculoskeletal: Normal range of motion.  Cardiovascular:     Rate and Rhythm: Normal rate and regular rhythm.     Heart sounds: Normal heart sounds. No murmur. No friction rub. No gallop.   Pulmonary:     Effort: Tachypnea present. No respiratory distress.     Breath sounds: Normal breath sounds.  Abdominal:     General: Bowel sounds are normal. There is no distension.     Palpations: Abdomen is soft. There is no mass.     Tenderness: There is no abdominal tenderness. There is no guarding.  Skin:    General: Skin is warm and dry.  Neurological:     Mental Status: She is alert and oriented to person, place, and time.  Psychiatric:        Behavior: Behavior normal.     Comments: Tearful      ED Treatments / Results  Labs (all labs ordered are listed, but only abnormal results are displayed) Labs Reviewed - No data to display  EKG None  Radiology No results found.  Procedures Procedures  (including critical care time)  Medications Ordered in ED Medications - No data to display   Initial Impression / Assessment and Plan / ED Course  I have reviewed the triage vital signs and the nursing notes.  Pertinent labs & imaging results that were available during my care of the patient were reviewed by me and considered in my medical decision making (see chart for details).   Patient here with shortness of breath.  Oxygen saturations above 90% with ambulation.  White blood cell count mildly elevated which I suspect is acute phase reaction in setting of stress.  Glucose is also mildly elevated.  Patient's EKG and troponin are negative.  She is heart score of 1.  She has a negative BNP and negative d-dimer.  No suspicion for pulmonary embolus.  I suspect this is related to panic attack.  Discussed results with the patient who appears appropriate for discharge at this time.  I have also discussed return precautions   Final Clinical Impressions(s) / ED Diagnoses   Final diagnoses:  Exertional dyspnea    ED Discharge Orders    None       Arthor Captain, PA-C 07/17/18 1610    Alvira Monday, MD 07/17/18 0830

## 2018-07-13 NOTE — ED Notes (Signed)
Abigail PA at bedside   

## 2018-07-14 ENCOUNTER — Encounter: Payer: Self-pay | Admitting: Physician Assistant

## 2018-07-14 ENCOUNTER — Telehealth: Payer: Self-pay | Admitting: Physician Assistant

## 2018-07-14 ENCOUNTER — Other Ambulatory Visit: Payer: Self-pay

## 2018-07-14 ENCOUNTER — Ambulatory Visit: Payer: BLUE CROSS/BLUE SHIELD | Admitting: Physician Assistant

## 2018-07-14 VITALS — BP 122/88 | HR 104 | Temp 98.2°F | Resp 14 | Ht 63.0 in | Wt 242.0 lb

## 2018-07-14 DIAGNOSIS — K219 Gastro-esophageal reflux disease without esophagitis: Secondary | ICD-10-CM

## 2018-07-14 DIAGNOSIS — R7303 Prediabetes: Secondary | ICD-10-CM

## 2018-07-14 DIAGNOSIS — R0609 Other forms of dyspnea: Secondary | ICD-10-CM | POA: Diagnosis not present

## 2018-07-14 DIAGNOSIS — R7301 Impaired fasting glucose: Secondary | ICD-10-CM

## 2018-07-14 LAB — POCT GLYCOSYLATED HEMOGLOBIN (HGB A1C): HbA1c, POC (prediabetic range): 6.1 % (ref 5.7–6.4)

## 2018-07-14 LAB — LIPID PANEL
Cholesterol: 248 mg/dL — ABNORMAL HIGH (ref 0–200)
HDL: 43.9 mg/dL (ref >39.00)
LDL Cholesterol: 170 mg/dL — ABNORMAL HIGH (ref 0–99)
NonHDL: 204.26
Total CHOL/HDL Ratio: 6
Triglycerides: 173 mg/dL — ABNORMAL HIGH (ref 0.0–149.0)
VLDL: 34.6 mg/dL (ref 0.0–40.0)

## 2018-07-14 LAB — CBC WITH DIFFERENTIAL/PLATELET
Basophils Absolute: 0 10*3/uL (ref 0.0–0.1)
Basophils Relative: 0.3 % (ref 0.0–3.0)
Eosinophils Absolute: 0.6 10*3/uL (ref 0.0–0.7)
Eosinophils Relative: 5.1 % — ABNORMAL HIGH (ref 0.0–5.0)
HCT: 35.8 % — ABNORMAL LOW (ref 36.0–46.0)
Hemoglobin: 11.5 g/dL — ABNORMAL LOW (ref 12.0–15.0)
Lymphocytes Relative: 22.9 % (ref 12.0–46.0)
Lymphs Abs: 2.9 10*3/uL (ref 0.7–4.0)
MCHC: 32.2 g/dL (ref 30.0–36.0)
MCV: 76.6 fl — ABNORMAL LOW (ref 78.0–100.0)
Monocytes Absolute: 0.6 10*3/uL (ref 0.1–1.0)
Monocytes Relative: 4.9 % (ref 3.0–12.0)
Neutro Abs: 8.4 10*3/uL — ABNORMAL HIGH (ref 1.4–7.7)
Neutrophils Relative %: 66.8 % (ref 43.0–77.0)
Platelets: 398 10*3/uL (ref 150.0–400.0)
RBC: 4.67 Mil/uL (ref 3.87–5.11)
RDW: 15.8 % — ABNORMAL HIGH (ref 11.5–15.5)
WBC: 12.5 10*3/uL — AB (ref 4.0–10.5)

## 2018-07-14 MED ORDER — PANTOPRAZOLE SODIUM 40 MG PO TBEC: 40.0000 mg | DELAYED_RELEASE_TABLET | Freq: Every day | ORAL | 3 refills | Status: DC

## 2018-07-14 NOTE — Patient Instructions (Signed)
Please go to the lab today for blood work.  I will call you with your results. We will alter treatment regimen(s) if indicated by your results.   Your A1C today was at 6.1 placing you in the pre-diabetic category. As such we are checking cholesterol and some other lab values today. Work on diet low in carbohydrates. No need for medication currently. Limit caffeine intake as this is a bladder irritant. I am setting you up with Nutrition for pre-diabetes.  Also start the Protonix once daily as directed for GERD. I believe it is contributing to some of your symptoms. Follow the dietary recommendations below. Follow-up 2 weeks for reassessment.  You will be contacted for further assessment by Cardiology.  If you note any chest pain or shortness of breath at rest, please return to the ER.   Again follow-up with me in 2 weeks.

## 2018-07-14 NOTE — Telephone Encounter (Signed)
See result note.  

## 2018-07-14 NOTE — Progress Notes (Signed)
Patient presents to clinic today for ER follow-up. Patient was seen yesterday 07/03/18 for exertional dyspnea. The ER MD note is not written currently so unable to view this. ER testing, vitals, nurse notes and lab/imaginf results reviewed in EMR along with discharge summary. Workup included labs (negative troponin, d-dimer; unremarkable metabolic panel; hgb 11.9, WBC 12), EKG (sinus tachycardia) and imaging (negative CXR). Was discharged to follow-up with PCP to get set up for Cardiology assessment with echocardiogram and potential stress testing. Denies any recurrence of chest pain. Denies palpitations, lightheadedness or dizziness. Still with dyspnea on exertion but not winded walking in from car. Notes a couple of months of significant and frequent heartburn. Denies nausea or vomiting. Denies abdominal pain, melena, hematochezia or tenesmus. .  Past Medical History:  Diagnosis Date  . Allergy   . Anxiety     No current outpatient medications on file prior to visit.   No current facility-administered medications on file prior to visit.     No Known Allergies  Family History  Problem Relation Age of Onset  . Cancer Mother        Breast, Thyroid  . Asthma Brother   . Cancer Maternal Grandmother   . Diabetes Maternal Grandfather   . Heart disease Maternal Grandfather   . Diabetes Paternal Grandmother   . Cirrhosis Paternal Grandfather     Social History   Socioeconomic History  . Marital status: Married    Spouse name: Not on file  . Number of children: Not on file  . Years of education: Not on file  . Highest education level: Not on file  Occupational History  . Not on file  Social Needs  . Financial resource strain: Not on file  . Food insecurity:    Worry: Not on file    Inability: Not on file  . Transportation needs:    Medical: Not on file    Non-medical: Not on file  Tobacco Use  . Smoking status: Former Smoker    Packs/day: 0.10    Start date: 08/13/2013   Last attempt to quit: 07/12/2014    Years since quitting: 4.0  . Smokeless tobacco: Never Used  Substance and Sexual Activity  . Alcohol use: No    Alcohol/week: 0.0 standard drinks  . Drug use: No  . Sexual activity: Yes    Birth control/protection: None  Lifestyle  . Physical activity:    Days per week: Not on file    Minutes per session: Not on file  . Stress: Not on file  Relationships  . Social connections:    Talks on phone: Not on file    Gets together: Not on file    Attends religious service: Not on file    Active member of club or organization: Not on file    Attends meetings of clubs or organizations: Not on file    Relationship status: Not on file  Other Topics Concern  . Not on file  Social History Narrative  . Not on file   Review of Systems - See HPI.  All other ROS are negative.  BP 122/88   Pulse (!) 104   Temp 98.2 F (36.8 C) (Oral)   Resp 14   Ht 5\' 3"  (1.6 m)   Wt 242 lb (109.8 kg)   SpO2 98%   BMI 42.87 kg/m   Physical Exam Vitals signs reviewed.  Constitutional:      Appearance: Normal appearance.  HENT:     Head: Normocephalic  and atraumatic.     Right Ear: Tympanic membrane normal.     Left Ear: Tympanic membrane normal.     Nose: Nose normal.  Neck:     Musculoskeletal: Neck supple.  Cardiovascular:     Rate and Rhythm: Normal rate and regular rhythm.     Pulses: Normal pulses.     Heart sounds: Normal heart sounds.  Pulmonary:     Effort: Pulmonary effort is normal.     Breath sounds: Normal breath sounds.  Abdominal:     General: Bowel sounds are normal. There is no distension.     Palpations: Abdomen is soft.     Tenderness: There is no abdominal tenderness.  Neurological:     General: No focal deficit present.     Mental Status: She is alert and oriented to person, place, and time.  Psychiatric:        Mood and Affect: Mood normal.     Recent Results (from the past 2160 hour(s))  TB Skin Test     Status: Normal    Collection Time: 04/28/18 10:29 AM  Result Value Ref Range   TB Skin Test Negative    Induration 0 mm  Progesterone     Status: None   Collection Time: 05/08/18  2:39 PM  Result Value Ref Range   Progesterone 3.9 ng/mL    Comment:                      Follicular phase       0.1 -   0.9                      Luteal phase           1.8 -  23.9                      Ovulation phase        0.1 -  12.0                      Pregnant                         First trimester    11.0 -  44.3                         Second trimester   25.4 -  83.3                         Third trimester    58.7 - 214.0                      Postmenopausal         0.0 -   0.1   CBC     Status: Abnormal   Collection Time: 07/13/18  8:22 AM  Result Value Ref Range   WBC 11.8 (H) 4.0 - 10.5 K/uL   RBC 4.86 3.87 - 5.11 MIL/uL   Hemoglobin 12.0 12.0 - 15.0 g/dL   HCT 16.138.8 09.636.0 - 04.546.0 %   MCV 79.8 (L) 80.0 - 100.0 fL   MCH 24.7 (L) 26.0 - 34.0 pg   MCHC 30.9 30.0 - 36.0 g/dL   RDW 40.915.2 81.111.5 - 91.415.5 %   Platelets 406 (H) 150 - 400 K/uL   nRBC 0.0 0.0 - 0.2 %  Comment: Performed at [facility], [address], [place], [place] [address]  Comprehensive metabolic panel     Status: Abnormal   Collection Time: 07/13/18  8:22 AM  Result Value Ref Range   Sodium 138 135 - 145 mmol/L   Potassium 4.0 3.5 - 5.1 mmol/L   Chloride 109 98 - 111 mmol/L   CO2 18 (L) 22 - 32 mmol/L   Glucose, Bld 120 (H) 70 - 99 mg/dL   BUN 8 6 - 20 mg/dL   Creatinine, Ser 6.04 0.44 - 1.00 mg/dL   Calcium 9.2 8.9 - 54.0 mg/dL   Total Protein 7.1 6.5 - 8.1 g/dL   Albumin 3.8 3.5 - 5.0 g/dL   AST 24 15 - 41 U/L   ALT 33 0 - 44 U/L   Alkaline Phosphatase 68 38 - 126 U/L   Total Bilirubin 0.7 0.3 - 1.2 mg/dL   GFR calc non Af Amer >60 >60 mL/min   GFR calc Af Amer >60 >60 mL/min   Anion gap 11 5 - 15    Comment: Performed at [facility], [address], [place], [place] [address]  D-dimer, quantitative     Status: None    Collection Time: 07/13/18  8:22 AM  Result Value Ref Range   D-Dimer, Quant <0.27 0.00 - 0.50 ug/mL-FEU    Comment: (NOTE) At the manufacturer cut-off of 0.50 ug/mL FEU, this assay has been documented to exclude PE with a sensitivity and negative predictive value of 97 to 99%.  At this time, this assay has not been approved by the FDA to exclude DVT/VTE. Results should be correlated with clinical presentation. Performed at [facility], [address], [place], [place] [address]   Brain natriuretic peptide     Status: None   Collection Time: 07/13/18  8:22 AM  Result Value Ref Range   B Natriuretic Peptide 14.7 0.0 - 100.0 pg/mL    Comment: Performed at [facility], [address], [place], [place] [address]  I-stat troponin, ED     Status: None   Collection Time: 07/13/18  9:12 AM  Result Value Ref Range   Troponin i, poc 0.00 0.00 - 0.08 ng/mL   Comment 3            Comment: Due to the release kinetics of cTnI, a negative result within the first hours of the onset of symptoms does not rule out myocardial infarction with certainty. If myocardial infarction is still suspected, repeat the test at appropriate intervals.   I-Stat beta hCG blood, ED     Status: None   Collection Time: 07/13/18  9:12 AM  Result Value Ref Range   I-stat hCG, quantitative <5.0 <5 mIU/mL   Comment 3            Comment:   GEST. AGE      CONC.  (mIU/mL)   <=1 WEEK        5 - 50     2 WEEKS       50 - 500     3 WEEKS       100 - 10,000     4 WEEKS     1,000 - 30,000        FEMALE AND NON-PREGNANT FEMALE:     LESS THAN 5 mIU/mL   I-stat Chem 8, ED     Status: Abnormal   Collection Time: 07/13/18  9:14 AM  Result Value  Ref Range   Sodium 138 135 - 145 mmol/L   Potassium 3.9 3.5 - 5.1 mmol/L   Chloride 110 98 - 111 mmol/L   BUN 7 6 - 20 mg/dL   Creatinine, Ser 8.11 (L) 0.44 - 1.00 mg/dL   Glucose, Bld 914 (H) 70 - 99 mg/dL   Calcium, Ion 7.82 9.56 - 1.40 mmol/L   TCO2 20 (L) 22 - 32  mmol/L   Hemoglobin 11.9 (L) 12.0 - 15.0 g/dL   HCT 21.3 (L) 08.6 - 57.8 %  I-stat troponin, ED     Status: None   Collection Time: 07/13/18 12:28 PM  Result Value Ref Range   Troponin i, poc 0.00 0.00 - 0.08 ng/mL   Comment 3            Comment: Due to the release kinetics of cTnI, a negative result within the first hours of the onset of symptoms does not rule out myocardial infarction with certainty. If myocardial infarction is still suspected, repeat the test at appropriate intervals.    Assessment/Plan: 1. Gastroesophageal reflux disease without esophagitis Start GERD diet. Rx Pantoprazole daily. Close follow-up scheduled.  - pantoprazole (PROTONIX) 40 MG tablet; Take 1 tablet (40 mg total) by mouth daily.  Dispense: 30 tablet; Refill: 3  2. Exertional dyspnea Exam good today. Vitals stable. Negative ER assessment. Will repeat CBC today. Will sent her to Cardiology for further assessment to r/o cardiac cause of symptoms. Question if reflux is causing a sensation of windedness. Will see how she responds to PPI therapy. Strict ER return precautions reviewed with patient.  - CBC w/Diff - Ambulatory referral to Cardiology  3. Elevated fasting glucose Noted on labs. POCT A1C at 6.1 making her pre-diabetic. Will refer to nutritionist. Check additional labs today. Begin carb-modified diet.  - POCT HgB A1C - Lipid Profile   Piedad Climes, [place]

## 2018-07-14 NOTE — Telephone Encounter (Signed)
Copied from CRM [phone]. Topic: General - Other >> Jul 14, 2018  4:35 PM Lynne LoganHudson, Caryn D wrote: Reason for CRM: Pt called to receive lab results. NT unavailable. Please reach out to pt. CB#[phone]

## 2018-07-28 ENCOUNTER — Ambulatory Visit: Payer: BLUE CROSS/BLUE SHIELD | Admitting: Obstetrics and Gynecology

## 2018-07-28 ENCOUNTER — Encounter: Payer: Self-pay | Admitting: Physician Assistant

## 2018-07-28 ENCOUNTER — Other Ambulatory Visit: Payer: Self-pay

## 2018-07-28 ENCOUNTER — Ambulatory Visit (INDEPENDENT_AMBULATORY_CARE_PROVIDER_SITE_OTHER): Payer: BLUE CROSS/BLUE SHIELD | Admitting: Physician Assistant

## 2018-07-28 VITALS — BP 112/70 | HR 79 | Temp 98.2°F | Resp 16 | Ht 63.0 in | Wt 237.0 lb

## 2018-07-28 DIAGNOSIS — Z30011 Encounter for initial prescription of contraceptive pills: Secondary | ICD-10-CM | POA: Diagnosis not present

## 2018-07-28 DIAGNOSIS — E669 Obesity, unspecified: Secondary | ICD-10-CM | POA: Diagnosis not present

## 2018-07-28 DIAGNOSIS — K219 Gastro-esophageal reflux disease without esophagitis: Secondary | ICD-10-CM

## 2018-07-28 LAB — POCT URINE PREGNANCY: Preg Test, Ur: NEGATIVE

## 2018-07-28 MED ORDER — NORGESTIM-ETH ESTRAD TRIPHASIC 0.18/0.215/0.25 MG-25 MCG PO TABS: 1.0000 | ORAL_TABLET | Freq: Every day | ORAL | 11 refills | Status: DC

## 2018-07-28 NOTE — Progress Notes (Signed)
Patient presents to clinic today for follow-up of GERD and atypical chest pain. Patient endorses taking PPI as directed. Is tolerating well without side effect. Notes complete resolution of symptoms with current regimen. Denies nausea/vomiting or abdominal/chest pain. Denies change in bowel or bladder regimen. Is keeping more active, walking 2 miles per day. Trying to increase to jogging. Has made significant change in diet and has lost 5 pounds.   Patient also wanting to restart OCPs. Is not desiring pregnancy currently. Is not a smoker. Denies personal or familiy history of clotting disorder. LMP ended 07/20/08.   Past Medical History:  Diagnosis Date  . Allergy   . Anxiety     Current Outpatient Medications on File Prior to Visit  Medication Sig Dispense Refill  . pantoprazole (PROTONIX) 40 MG tablet Take 1 tablet (40 mg total) by mouth daily. 30 tablet 3   No current facility-administered medications on file prior to visit.     No Known Allergies  Family History  Problem Relation Age of Onset  . Cancer Mother        Breast, Thyroid  . Asthma Brother   . Cancer Maternal Grandmother   . Diabetes Maternal Grandfather   . Heart disease Maternal Grandfather   . Diabetes Paternal Grandmother   . Cirrhosis Paternal Grandfather     Social History   Socioeconomic History  . Marital status: Married    Spouse name: Not on file  . Number of children: Not on file  . Years of education: Not on file  . Highest education level: Not on file  Occupational History  . Not on file  Social Needs  . Financial resource strain: Not on file  . Food insecurity:    Worry: Not on file    Inability: Not on file  . Transportation needs:    Medical: Not on file    Non-medical: Not on file  Tobacco Use  . Smoking status: Former Smoker    Packs/day: 0.10    Start date: 08/13/2013    Last attempt to quit: 07/12/2014    Years since quitting: 4.0  . Smokeless tobacco: Never Used  Substance  and Sexual Activity  . Alcohol use: No    Alcohol/week: 0.0 standard drinks  . Drug use: No  . Sexual activity: Yes    Birth control/protection: None  Lifestyle  . Physical activity:    Days per week: Not on file    Minutes per session: Not on file  . Stress: Not on file  Relationships  . Social connections:    Talks on phone: Not on file    Gets together: Not on file    Attends religious service: Not on file    Active member of club or organization: Not on file    Attends meetings of clubs or organizations: Not on file    Relationship status: Not on file  Other Topics Concern  . Not on file  Social History Narrative  . Not on file    Review of Systems - See HPI.  All other ROS are negative.  There were no vitals taken for this visit.  Physical Exam Vitals signs reviewed.  Constitutional:      Appearance: Normal appearance.  HENT:     Head: Normocephalic and atraumatic.  Cardiovascular:     Rate and Rhythm: Normal rate and regular rhythm.     Pulses: Normal pulses.     Heart sounds: Normal heart sounds.  Pulmonary:  Effort: Pulmonary effort is normal.     Breath sounds: Normal breath sounds.  Abdominal:     General: Bowel sounds are normal. There is no distension.     Palpations: Abdomen is soft. There is no mass.     Tenderness: There is no abdominal tenderness.  Neurological:     Mental Status: She is alert.  Psychiatric:        Mood and Affect: Mood normal.     Recent Results (from the past 2160 hour(s))  Progesterone     Status: None   Collection Time: 05/08/18  2:39 PM  Result Value Ref Range   Progesterone 3.9 ng/mL    Comment:                      Follicular phase       0.1 -   0.9                      Luteal phase           1.8 -  23.9                      Ovulation phase        0.1 -  12.0                      Pregnant                         First trimester    11.0 -  44.3                         Second trimester   25.4 -  83.3                          Third trimester    58.7 - 214.0                      Postmenopausal         0.0 -   0.1   CBC     Status: Abnormal   Collection Time: 07/13/18  8:22 AM  Result Value Ref Range   WBC 11.8 (H) 4.0 - 10.5 K/uL   RBC 4.86 3.87 - 5.11 MIL/uL   Hemoglobin 12.0 12.0 - 15.0 g/dL   HCT 16.138.8 09.636.0 - 04.546.0 %   MCV 79.8 (L) 80.0 - 100.0 fL   MCH 24.7 (L) 26.0 - 34.0 pg   MCHC 30.9 30.0 - 36.0 g/dL   RDW 40.915.2 81.111.5 - 91.415.5 %   Platelets 406 (H) 150 - 400 K/uL   nRBC 0.0 0.0 - 0.2 %    Comment: Performed at [facility], [address] [place], [place] [id number]  Comprehensive metabolic panel     Status: Abnormal   Collection Time: 07/13/18  8:22 AM  Result Value Ref Range   Sodium 138 135 - 145 mmol/L   Potassium 4.0 3.5 - 5.1 mmol/L   Chloride 109 98 - 111 mmol/L   CO2 18 (L) 22 - 32 mmol/L   Glucose, Bld 120 (H) 70 - 99 mg/dL   BUN 8 6 - 20 mg/dL   Creatinine, Ser 6.210.63 0.44 - 1.00 mg/dL   Calcium 9.2 8.9 - 30.810.3 mg/dL   Total Protein 7.1 6.5 - 8.1 g/dL  Albumin 3.8 3.5 - 5.0 g/dL   AST 24 15 - 41 U/L   ALT 33 0 - 44 U/L   Alkaline Phosphatase 68 38 - 126 U/L   Total Bilirubin 0.7 0.3 - 1.2 mg/dL   GFR calc non Af Amer >60 >60 mL/min   GFR calc Af Amer >60 >60 mL/min   Anion gap 11 5 - 15    Comment: Performed at [facility], [address] [place], [place] [address]  D-dimer, quantitative     Status: None   Collection Time: 07/13/18  8:22 AM  Result Value Ref Range   D-Dimer, Quant <0.27 0.00 - 0.50 ug/mL-FEU    Comment: (NOTE) At the manufacturer cut-off of 0.50 ug/mL FEU, this assay has been documented to exclude PE with a sensitivity and negative predictive value of 97 to 99%.  At this time, this assay has not been approved by the FDA to exclude DVT/VTE. Results should be correlated with clinical presentation. Performed at [facility], [address], [place], [place] [address]   Brain natriuretic peptide     Status: None   Collection Time:  07/13/18  8:22 AM  Result Value Ref Range   B Natriuretic Peptide 14.7 0.0 - 100.0 pg/mL    Comment: Performed at [facility], [address], [place], [place] [address]  I-stat troponin, ED     Status: None   Collection Time: 07/13/18  9:12 AM  Result Value Ref Range   Troponin i, poc 0.00 0.00 - 0.08 ng/mL   Comment 3            Comment: Due to the release kinetics of cTnI, a negative result within the first hours of the onset of symptoms does not rule out myocardial infarction with certainty. If myocardial infarction is still suspected, repeat the test at appropriate intervals.   I-Stat beta hCG blood, ED     Status: None   Collection Time: 07/13/18  9:12 AM  Result Value Ref Range   I-stat hCG, quantitative <5.0 <5 mIU/mL   Comment 3            Comment:   GEST. AGE      CONC.  (mIU/mL)   <=1 WEEK        5 - 50     2 WEEKS       50 - 500     3 WEEKS       100 - 10,000     4 WEEKS     1,000 - 30,000        FEMALE AND NON-PREGNANT FEMALE:     LESS THAN 5 mIU/mL   I-stat Chem 8, ED     Status: Abnormal   Collection Time: 07/13/18  9:14 AM  Result Value Ref Range   Sodium 138 135 - 145 mmol/L   Potassium 3.9 3.5 - 5.1 mmol/L   Chloride 110 98 - 111 mmol/L   BUN 7 6 - 20 mg/dL   Creatinine, Ser 2.77 (L) 0.44 - 1.00 mg/dL   Glucose, Bld 412 (H) 70 - 99 mg/dL   Calcium, Ion 8.78 6.76 - 1.40 mmol/L   TCO2 20 (L) 22 - 32 mmol/L   Hemoglobin 11.9 (L) 12.0 - 15.0 g/dL   HCT 72.0 (L) 94.7 - 09.6 %  I-stat troponin, ED     Status: None   Collection Time: 07/13/18 12:28 PM  Result Value Ref Range   Troponin i,  poc 0.00 0.00 - 0.08 ng/mL   Comment 3            Comment: Due to the release kinetics of cTnI, a negative result within the first hours of the onset of symptoms does not rule out myocardial infarction with certainty. If myocardial infarction is still suspected, repeat the test at appropriate intervals.   POCT HgB A1C     Status: Abnormal   Collection Time:  07/14/18 11:29 AM  Result Value Ref Range   Hemoglobin A1C     HbA1c POC (<> result, manual entry)     HbA1c, POC (prediabetic range) 6.1 5.7 - 6.4 %   HbA1c, POC (controlled diabetic range)    CBC w/Diff     Status: Abnormal   Collection Time: 07/14/18 11:37 AM  Result Value Ref Range   WBC 12.5 (H) 4.0 - 10.5 K/uL   RBC 4.67 3.87 - 5.11 Mil/uL   Hemoglobin 11.5 (L) 12.0 - 15.0 g/dL   HCT 16.1 (L) 09.6 - 04.5 %   MCV 76.6 (L) 78.0 - 100.0 fl   MCHC 32.2 30.0 - 36.0 g/dL   RDW 40.9 (H) 81.1 - 91.4 %   Platelets 398.0 150.0 - 400.0 K/uL   Neutrophils Relative % 66.8 43.0 - 77.0 %   Lymphocytes Relative 22.9 12.0 - 46.0 %   Monocytes Relative 4.9 3.0 - 12.0 %   Eosinophils Relative 5.1 (H) 0.0 - 5.0 %   Basophils Relative 0.3 0.0 - 3.0 %   Neutro Abs 8.4 (H) 1.4 - 7.7 K/uL   Lymphs Abs 2.9 0.7 - 4.0 K/uL   Monocytes Absolute 0.6 0.1 - 1.0 K/uL   Eosinophils Absolute 0.6 0.0 - 0.7 K/uL   Basophils Absolute 0.0 0.0 - 0.1 K/uL  Lipid Profile     Status: Abnormal   Collection Time: 07/14/18 11:37 AM  Result Value Ref Range   Cholesterol 248 (H) 0 - 200 mg/dL    Comment: ATP III Classification       Desirable:  < 200 mg/dL               Borderline High:  200 - 239 mg/dL          High:  > = 782 mg/dL   Triglycerides 956.2 (H) 0.0 - 149.0 mg/dL    Comment: Normal:  <130 mg/dLBorderline High:  150 - 199 mg/dL   HDL 86.57 >84.69 mg/dL   VLDL 62.9 0.0 - 52.8 mg/dL   LDL Cholesterol 413 (H) 0 - 99 mg/dL   Total CHOL/HDL Ratio 6     Comment:                Men          Women1/2 Average Risk     3.4          3.3Average Risk          5.0          4.42X Average Risk          9.6          7.13X Average Risk          15.0          11.0                       NonHDL 204.26     Comment: NOTE:  Non-HDL goal should be 30 mg/dL higher than patient's LDL goal (i.e. LDL goal of <  70 mg/dL, would have non-HDL goal of < 100 mg/dL)    Assessment/Plan: 1. Encounter for initial prescription of  contraceptive pills Urine pregnancy negative. Will start OCPs. Follow-up scheduled. Will update PAP at that time.  - Norgestimate-Ethinyl Estradiol Triphasic (ORTHO TRI-CYCLEN LO) 0.18/0.215/0.25 MG-25 MCG tab; Take 1 tablet by mouth daily.  Dispense: 1 Package; Refill: 11 - POCT urine pregnancy  2. Gastroesophageal reflux disease without esophagitis Doing very well with current regimen. Has made substantial changes to diet and exercise, losing 5 lbs thus far. Will have her continue PPI daily x 2 week. Then try to decrease to QOD dosing. Hopefully will be able to wean fully off and use PRN.  3. Obesity (BMI 30-39.9) Lost 5 lbs thus far with dietary changes and exercise regimen. iIll continue to monitor. Repeat A1C in 3-6 months.     Piedad ClimesWilliam Cody Nathyn Luiz, PA-C

## 2018-07-28 NOTE — Patient Instructions (Signed)
Please keep up with dietary changes and exercise. Continue the Protonix daily for 2 more weeks.  Then try to decrease to every other day dosing to see if you still need as much of the medication.   Start the OCPs as directed. Follow-up with me in 3 months for reassessment of glucose levels.

## 2018-08-07 ENCOUNTER — Ambulatory Visit: Payer: Self-pay | Admitting: Registered"

## 2018-08-22 ENCOUNTER — Other Ambulatory Visit: Payer: Self-pay | Admitting: Emergency Medicine

## 2018-08-22 DIAGNOSIS — Z30011 Encounter for initial prescription of contraceptive pills: Secondary | ICD-10-CM

## 2018-08-22 DIAGNOSIS — K219 Gastro-esophageal reflux disease without esophagitis: Secondary | ICD-10-CM

## 2018-08-22 NOTE — Telephone Encounter (Signed)
Received a medication request from [facility] [employer] for prescriptions for Pantoprazole and Tri-Lo Marzia. Called patient to verify if she requested medications thru pharmacy.

## 2018-08-23 MED ORDER — NORGESTIM-ETH ESTRAD TRIPHASIC 0.18/0.215/0.25 MG-25 MCG PO TABS: 1.0000 | ORAL_TABLET | Freq: Every day | ORAL | 1 refills | Status: DC

## 2018-08-23 MED ORDER — PANTOPRAZOLE SODIUM 40 MG PO TBEC: 40.0000 mg | DELAYED_RELEASE_TABLET | Freq: Every day | ORAL | 1 refills | Status: DC

## 2018-08-23 NOTE — Telephone Encounter (Signed)
Pt called and stated that she did request. Please send in.

## 2018-08-24 ENCOUNTER — Other Ambulatory Visit: Payer: Self-pay | Admitting: Physician Assistant

## 2018-08-24 DIAGNOSIS — Z30011 Encounter for initial prescription of contraceptive pills: Secondary | ICD-10-CM

## 2018-08-24 DIAGNOSIS — K219 Gastro-esophageal reflux disease without esophagitis: Secondary | ICD-10-CM

## 2018-08-24 MED ORDER — PANTOPRAZOLE SODIUM 40 MG PO TBEC: 40.0000 mg | DELAYED_RELEASE_TABLET | Freq: Every day | ORAL | 1 refills | Status: DC

## 2018-08-24 MED ORDER — NORGESTIM-ETH ESTRAD TRIPHASIC 0.18/0.215/0.25 MG-25 MCG PO TABS: 1.0000 | ORAL_TABLET | Freq: Every day | ORAL | 1 refills | Status: DC

## 2018-08-24 NOTE — Telephone Encounter (Signed)
Copied from CRM [id number]. Topic: Quick Communication - See Telephone Encounter >> Aug 24, 2018  4:21 PM Jens Som A wrote: CRM for notification. See Telephone encounter for: 08/24/18.  Patient is calling to see if her scripts can be sent to a different pharmacy please advise pantoprazole (PROTONIX) 40 MG tablet [[id number]] & Norgestimate-Ethinyl Estradiol Triphasic (ORTHO TRI-CYCLEN LO) 0.18/0.215/0.25 MG-25 MCG tab [[id number]]  [facility] [address] [place] [phone]

## 2018-08-24 NOTE — Telephone Encounter (Signed)
Medication resent to requested pharmacy.  

## 2018-09-11 ENCOUNTER — Ambulatory Visit: Payer: Self-pay | Admitting: Cardiovascular Disease

## 2018-09-12 NOTE — Progress Notes (Signed)
Cardiology Office Note   Date:  09/15/2018   ID:  Faryn Chrzanowski, DOB 1996/04/16, MRN [id number]  PCP:  Waldon Merl, PA-C  Cardiologist:   Charlton Haws, MD   No chief complaint on file.     History of Present Illness: Sonya Santiago is a 23 y.o. female who presents for chest pain. Referred by Marcelline Mates PA-C GI overtones With GERD and improvement with PPI. Seen in ER 07/13/18 History of anxiety / panic attacks Had dyspnea Walking to bathroom No acute ECG changes D dimer and BNP negative R/O with no acute ECG changes CXR NAD   Ok since d/c notes cafeinnated beverages cause her chest to tighten and get dyspnea   Past Medical History:  Diagnosis Date  . Allergy   . Anxiety     Past Surgical History:  Procedure Laterality Date  . TONSILLECTOMY AND ADENOIDECTOMY    . WISDOM TOOTH EXTRACTION       Current Outpatient Medications  Medication Sig Dispense Refill  . Norgestimate-Ethinyl Estradiol Triphasic (ORTHO TRI-CYCLEN LO) 0.18/0.215/0.25 MG-25 MCG tab Take 1 tablet by mouth daily. 3 Package 1  . pantoprazole (PROTONIX) 40 MG tablet Take 1 tablet (40 mg total) by mouth daily. 90 tablet 1   No current facility-administered medications for this visit.     Allergies:   Patient has no known allergies.    Social History:  The patient  reports that she quit smoking about 4 years ago. She started smoking about 5 years ago. She smoked 0.10 packs per day. She has never used smokeless tobacco. She reports that she does not drink alcohol or use drugs.   Family History:  The patient's family history includes Asthma in her brother; Cancer in her maternal grandmother and mother; Cirrhosis in her paternal grandfather; Diabetes in her maternal grandfather and paternal grandmother; Heart disease in her maternal grandfather.    ROS:  Please see the history of present illness.   Otherwise, review of systems are positive for none.   All other systems are reviewed and negative.     PHYSICAL EXAM: VS:  BP 128/80   Pulse 74   Ht 5\' 3"  (1.6 m)   Wt 105.1 kg   SpO2 98%   BMI 41.06 kg/m  , BMI Body mass index is 41.06 kg/m. Affect appropriate Overweight white female  HEENT: normal Neck supple with no adenopathy JVP normal no bruits no thyromegaly Lungs clear with no wheezing and good diaphragmatic motion Heart:  S1/S2 no murmur, no rub, gallop or click PMI normal Abdomen: benighn, BS positve, no tenderness, no AAA no bruit.  No HSM or HJR Distal pulses intact with no bruits No edema Neuro non-focal Skin warm and dry No muscular weakness    EKG:  09/15/18 SR rate 74 normal    Recent Labs: 07/13/2018: ALT 33; B Natriuretic Peptide 14.7; BUN 7; Creatinine, Ser 0.40; Potassium 3.9; Sodium 138 07/14/2018: Hemoglobin 11.5; Platelets 398.0    Lipid Panel    Component Value Date/Time   CHOL 248 (H) 07/14/2018 1137   CHOL 162 08/27/2013 1046   TRIG 173.0 (H) 07/14/2018 1137   HDL 43.90 07/14/2018 1137   HDL 49 08/27/2013 1046   CHOLHDL 6 07/14/2018 1137   VLDL 34.6 07/14/2018 1137   LDLCALC 170 (H) 07/14/2018 1137   LDLCALC 96 08/27/2013 1046      Wt Readings from Last 3 Encounters:  09/15/18 105.1 kg  07/28/18 107.5 kg  07/14/18 109.8 kg  Other studies Reviewed: Additional studies/ records that were reviewed today include: Notes from ER, labs, CXR ECG notes from primary .    ASSESSMENT AND PLAN:  1.  Chest Pain:  Atypical improved with PPI. ECG normal negative ER w/u f/u ETT 2.  GERD:  Discussed low carb diet weight loss and PPI Rx  3. Anxiety:  F/u primary related to chest pain and panic attacks  4. Dyspnea:  Seems functional CXR ok f/u TTE assess Rv/Lv function    Current medicines are reviewed at length with the patient today.  The patient does not have concerns regarding medicines.  The following changes have been made:  no change  Labs/ tests ordered today include: ETT  And TTE   Orders Placed This Encounter   Procedures  . EXERCISE TOLERANCE TEST (ETT)  . ECHOCARDIOGRAM COMPLETE     Disposition:   FU with cardiology PRN      Signed, Charlton Haws, MD  09/15/2018 1:27 PM    [facility] [address] [place], [place], [place]  [address] Phone: [phone]; Fax: [phone]

## 2018-09-15 ENCOUNTER — Encounter: Payer: Self-pay | Admitting: Cardiovascular Disease

## 2018-09-15 ENCOUNTER — Ambulatory Visit (INDEPENDENT_AMBULATORY_CARE_PROVIDER_SITE_OTHER): Payer: BLUE CROSS/BLUE SHIELD | Admitting: Cardiovascular Disease

## 2018-09-15 VITALS — BP 128/80 | HR 74 | Ht 63.0 in | Wt 231.8 lb

## 2018-09-15 DIAGNOSIS — R079 Chest pain, unspecified: Secondary | ICD-10-CM

## 2018-09-15 DIAGNOSIS — R0600 Dyspnea, unspecified: Secondary | ICD-10-CM | POA: Diagnosis not present

## 2018-09-15 NOTE — Patient Instructions (Signed)
Medication Instructions:   If you need a refill on your cardiac medications before your next appointment, please call your pharmacy.   Lab work:  If you have labs (blood work) drawn today and your tests are completely normal, you will receive your results only by: . MyChart Message (if you have MyChart) OR . A paper copy in the mail If you have any lab test that is abnormal or we need to change your treatment, we will call you to review the results.  Testing/Procedures: Your physician has requested that you have an echocardiogram. Echocardiography is a painless test that uses sound waves to create images of your heart. It provides your doctor with information about the size and shape of your heart and how well your heart's chambers and valves are working. This procedure takes approximately one hour. There are no restrictions for this procedure.  Your physician has requested that you have an exercise tolerance test. For further information please visit www.[email or web address]. Please also follow instruction sheet, as given.  Follow-Up: At [facility], you and your health needs are our priority.  As part of our continuing mission to provide you with exceptional heart care, we have created designated Provider Care Teams.  These Care Teams include your primary Cardiologist (physician) and Advanced Practice Providers (APPs -  Physician Assistants and Nurse Practitioners) who all work together to provide you with the care you need, when you need it. Your physician recommends that you schedule a follow-up appointment as needed with Dr. Nishan.  

## 2018-09-19 NOTE — Addendum Note (Signed)
Addended by: Solon Augusta on: 09/19/2018 08:02 AM   Modules accepted: Orders

## 2018-09-29 ENCOUNTER — Other Ambulatory Visit (HOSPITAL_COMMUNITY): Payer: Self-pay

## 2018-10-27 ENCOUNTER — Ambulatory Visit: Payer: Self-pay | Admitting: Physician Assistant

## 2018-10-30 ENCOUNTER — Ambulatory Visit: Payer: Self-pay | Admitting: Physician Assistant

## 2018-11-15 ENCOUNTER — Other Ambulatory Visit: Payer: Self-pay

## 2018-11-15 ENCOUNTER — Ambulatory Visit (INDEPENDENT_AMBULATORY_CARE_PROVIDER_SITE_OTHER): Payer: BLUE CROSS/BLUE SHIELD | Admitting: Physician Assistant

## 2018-11-15 ENCOUNTER — Encounter: Payer: Self-pay | Admitting: Physician Assistant

## 2018-11-15 VITALS — Wt 228.0 lb

## 2018-11-15 DIAGNOSIS — R7303 Prediabetes: Secondary | ICD-10-CM | POA: Diagnosis not present

## 2018-11-15 DIAGNOSIS — K219 Gastro-esophageal reflux disease without esophagitis: Secondary | ICD-10-CM | POA: Insufficient documentation

## 2018-11-15 NOTE — Patient Instructions (Signed)
Instructions Sent to MyChart.   It was nice talking with you today!  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 Try to make sure you are getting in 30 minutes of exercise on days you are not working. Really watch the snacking, especially late-night eating. I know it is harder to meal prep now but I would recommend trying to work on this on your days off so that when you need to grab something quickly, it is something healthier for you.  Consider starting a daily cinnamon supplement. You can find this in the vitamin section. This can help with your insulin resistance and glucose levels.  We will follow-up in June to reassess and to update your A1C.  Please schedule this when you are able.   Hang in there!

## 2018-11-15 NOTE — Progress Notes (Signed)
   Virtual Visit via Video   I connected with patient on 11/15/18 at  1:20 PM EDT by a video enabled telemedicine application and verified that I am speaking with the correct person using two identifiers.  Location patient: Home Location provider: [place], Office Persons participating in the virtual visit: Patient, Provider, CMA Adela Glimpse)  I discussed the limitations of evaluation and management by telemedicine and the availability of in person appointments. The patient expressed understanding and agreed to proceed.  Subjective:   HPI:   Patient presents via Doxy.Me today to follow-up on Obesity, Pre-Diabetes and GERD.  Obesity -- Body mass index is 40.39 kg/m. Notes that she was doing well overall until COVID. Has gotten off track with her diet. Notes portion sizes are the biggest issue for her. Denies any late-night eating. Is staying very active at work.   Pre-Diabetes -- + history. Last A1C in December at 6.1. Diet and activity as noted above.   GERD -- Notes doing very well with her current PPI regimen. Has cut back to every other day dosing. Denies breakthrough symptoms with the QOD dosing. Notes if she tries to spread out further she will get heart burn that is significant.   ROS:   See pertinent positives and negatives per HPI.  Patient Active Problem List   Diagnosis Date Noted  . Infertility, anovulation 08/30/2016  . Anovulation 08/09/2016    Social History   Tobacco Use  . Smoking status: Former Smoker    Packs/day: 0.10    Start date: 08/13/2013    Last attempt to quit: 07/12/2014    Years since quitting: 4.3  . Smokeless tobacco: Never Used  Substance Use Topics  . Alcohol use: No    Alcohol/week: 0.0 standard drinks    Current Outpatient Medications:  .  Norgestimate-Ethinyl Estradiol Triphasic (ORTHO TRI-CYCLEN LO) 0.18/0.215/0.25 MG-25 MCG tab, Take 1 tablet by mouth daily., Disp: 3 Package, Rfl: 1 .  pantoprazole (PROTONIX) 40 MG  tablet, Take 1 tablet (40 mg total) by mouth daily., Disp: 90 tablet, Rfl: 1  No Known Allergies  Objective:   Wt 228 lb (103.4 kg)   BMI 40.39 kg/m   Patient is well-developed, well-nourished in no acute distress.  Resting comfortably at home.  Head is normocephalic, atraumatic.  No labored breathing.  Speech is clear and coherent with logical contest.  Patient is alert and oriented at baseline.   Assessment and Plan:   1. Pre-diabetes Has gotten off track with diet. Last A1C at 6.1. Reviewed dietary and exercise recommendations with patient and their importance. She notes she will work on this. Handout sent to MyChart with instructions. Follow-up in June (in office) for repeat assessment and to update labs.  2. Morbid obesity (HCC) Body mass index is 40.39 kg/m. Dietary and exercise recommendations reviewed with patient. Handout sent to MyChart. Consider dietician once things are calmed down with COVID.  3. Gastroesophageal reflux disease without esophagitis Doing very well overall. Continue current regimen.     Piedad Climes, PA-C 11/15/2018

## 2018-11-25 ENCOUNTER — Other Ambulatory Visit: Payer: Self-pay | Admitting: Physician Assistant

## 2018-11-25 DIAGNOSIS — Z30011 Encounter for initial prescription of contraceptive pills: Secondary | ICD-10-CM

## 2018-12-20 ENCOUNTER — Telehealth (HOSPITAL_COMMUNITY): Payer: Self-pay | Admitting: Radiology

## 2018-12-20 NOTE — Telephone Encounter (Signed)
Left message to call-Needs to schedule echocardiogram.

## 2018-12-28 ENCOUNTER — Telehealth (HOSPITAL_COMMUNITY): Payer: Self-pay | Admitting: Radiology

## 2018-12-28 NOTE — Telephone Encounter (Signed)
Left message to call office-Patient needs to schedule an echocardiogram.  

## 2019-01-10 ENCOUNTER — Telehealth (HOSPITAL_COMMUNITY): Payer: Self-pay | Admitting: Radiology

## 2019-01-10 NOTE — Telephone Encounter (Signed)
Called patient to schedule echocardiogram. She feels she doesn't need the test at this time. Dr. Johnsie Cancel notified.

## 2019-01-12 MED FILL — PANTOPRAZOLE SOD DR 40 MG T: 40 | 30 days supply | Qty: 30 | Fill #0

## 2019-01-12 MED FILL — TRI-LO-SPRINTEC TABLET: 0.18/0.215/ | 84 days supply | Qty: 84 | Fill #0

## 2019-01-25 ENCOUNTER — Other Ambulatory Visit: Payer: Self-pay

## 2019-01-25 ENCOUNTER — Encounter: Payer: Self-pay | Admitting: Physician Assistant

## 2019-01-25 ENCOUNTER — Ambulatory Visit (INDEPENDENT_AMBULATORY_CARE_PROVIDER_SITE_OTHER): Payer: BC Managed Care – PPO | Admitting: Physician Assistant

## 2019-01-25 VITALS — HR 107 | Wt 232.0 lb

## 2019-01-25 DIAGNOSIS — F419 Anxiety disorder, unspecified: Secondary | ICD-10-CM | POA: Diagnosis not present

## 2019-01-25 DIAGNOSIS — F329 Major depressive disorder, single episode, unspecified: Secondary | ICD-10-CM | POA: Diagnosis not present

## 2019-01-25 MED ORDER — FLUOXETINE HCL 20 MG PO TABS: 20.0000 mg | ORAL_TABLET | Freq: Every day | ORAL | 3 refills | Status: DC

## 2019-01-25 NOTE — Progress Notes (Signed)
   Virtual Visit via Video   I connected with patient on 01/25/19 at  9:00 AM EDT by a video enabled telemedicine application and verified that I am speaking with the correct person using two identifiers.  Location patient: Home Location provider: [place] [facility], Office Persons participating in the virtual visit: Patient, Provider, [place] (Patina Moore)  I discussed the limitations of evaluation and management by telemedicine and the availability of in person appointments. The patient expressed understanding and agreed to proceed.  Subjective:   HPI:   Patient presents via Doxy.Me today to discuss mood. Patient endorses prior history of anxiety and depression, treated with Wellbutrin XL 150 mg. Notes fatigue, depressed mood and anxiety. Notes depressed mood comes and goes but has worsened in the past 1+ months. Endorses change to sleep, energy and appetite (stress eating). Denies panic attack but overall heightened level of anxiety. Notes increase in stress and worry. Notes difficulty with sleep onset mainly. Occasionally some issue with sleep maintenance. Averaging about 5 hours of sleep per night. Has tried OTC sleep aids that make her feel excessively groggy the next day. Patient denies SI/HI   ROS:   See pertinent positives and negatives per HPI.  Patient Active Problem List   Diagnosis Date Noted  . Pre-diabetes 11/15/2018  . Gastroesophageal reflux disease without esophagitis 11/15/2018  . Morbid obesity ([place]) 11/15/2018  . Infertility, anovulation 08/30/2016  . Anovulation 08/09/2016    Social History   Tobacco Use  . Smoking status: Former Smoker    Packs/day: 0.10    Start date: 08/13/2013    Quit date: 07/12/2014    Years since quitting: 4.5  . Smokeless tobacco: Never Used  Substance Use Topics  . Alcohol use: No    Alcohol/week: 0.0 standard drinks    Current Outpatient Medications:  .  Norgestimate-Ethinyl Estradiol Triphasic 0.18/0.215/0.25 MG-25 MCG tab,  Take 1 tablet by mouth daily., Disp: 84 tablet, Rfl: 0 .  pantoprazole (PROTONIX) 40 MG tablet, Take 1 tablet (40 mg total) by mouth daily., Disp: 90 tablet, Rfl: 1  No Known Allergies  Objective:   There were no vitals taken for this visit.  Patient is well-developed, well-nourished in no acute distress.  Resting comfortably at home.  Head is normocephalic, atraumatic.  No labored breathing.  Speech is clear and coherent with logical contest.  Patient is alert and oriented at baseline.   Assessment and Plan:   1. Anxiety and depression Will start Fluoxetine 20 mg daily. Rx sent in. Will have her work on sleep hygiene practices and mindfulness training. Handout sent to MyChart. Start Pure ZZZs at night. Will follow-up 3 weeks to reassess.     Leeanne Rio, PA-C 01/25/2019

## 2019-01-25 NOTE — Patient Instructions (Signed)
Instructions sent to MyChart.   Please take the Fluoxetine once daily as directed. Restart Pure ZZZs and try to review and implement the sleep hygiene practices noted below. Download either the Calm or Headspace app on your phone so you can work on Mindfulness training to help with anxiety levels. Follow-up with me via Video or in-office in 3 weeks.  Hang in there! Sleep Hygiene  Do: (1) Go to bed at the same time each day. (2) Get up from bed at the same time each day. (3) Get regular exercise each day, preferably in the morning.  There is goof evidence that regular exercise improves restful sleep.  This includes stretching and aerobic exercise. (4) Get regular exposure to outdoor or bright lights, especially in the late afternoon. (5) Keep the temperature in your bedroom comfortable. (6) Keep the bedroom quiet when sleeping. (7) Keep the bedroom dark enough to facilitate sleep. (8) Use your bed only for sleep and sex. (9) Take medications as directed.  It is helpful to take prescribed sleeping pills 1 hour before bedtime, so they are causing drowsiness when you lie down, or 10 hours before getting up, to avoid daytime drowsiness. (10) Use a relaxation exercise just before going to sleep -- imagery, massage, warm bath. (11) Keep your feet and hands warm.  Wear warm socks and/or mittens or gloves to bed.  Don't: (1) Exercise just before going to bed. (2) Engage in stimulating activity just before bed, such as playing a competitive game, watching an exciting program on television, or having an important discussion with a loved one. (3) Have caffeine in the evening (coffee, teas, chocolate, sodas, etc.) (4) Read or watch television in bed. (5) Use alcohol to help you sleep. (6) Go to bed too hungry or too full. (7) Take another person's sleeping pills. (8) Take over-the-counter sleeping pills, without your doctor's knowledge.  Tolerance can develop rapidly with these medications.   Diphenhydramine can have serious side effects for elderly patients. (9) Take daytime naps. (10) Command yourself to go to sleep.  This only makes your mind and body more alert.  If you lie awake for more than 20-30 minutes, get up, go to a different room, participate in a quiet activity (Ex - non-excitable reading or television), and then return to bed when you feel sleepy.  Do this as many times during the night as needed.  This may cause you to have a night or two of poor sleep but it will train your brain to know when it is time for sleep.

## 2019-01-25 NOTE — Progress Notes (Signed)
I have discussed the procedure for the virtual visit with the patient who has given consent to proceed with assessment and treatment.   Sonya Santiago S Tonia Avino, CMA     

## 2019-02-05 DIAGNOSIS — Z319 Encounter for procreative management, unspecified: Secondary | ICD-10-CM | POA: Diagnosis not present

## 2019-02-12 ENCOUNTER — Ambulatory Visit: Payer: BC Managed Care – PPO | Admitting: Physician Assistant

## 2019-02-12 ENCOUNTER — Telehealth: Payer: Self-pay | Admitting: Physician Assistant

## 2019-02-12 DIAGNOSIS — K219 Gastro-esophageal reflux disease without esophagitis: Secondary | ICD-10-CM

## 2019-02-12 MED ORDER — PANTOPRAZOLE SODIUM 40 MG PO TBEC: 40.0000 mg | DELAYED_RELEASE_TABLET | Freq: Every day | ORAL | 0 refills | Status: DC

## 2019-02-12 MED FILL — PANTOPRAZOLE SOD DR 40 MG T: 40 | 90 days supply | Qty: 90 | Fill #0

## 2019-02-12 NOTE — Telephone Encounter (Signed)
Pt called in to reschedule her appt today due to being exposed to someone with covid at work last night. Pt did reschedule her appt. To 02/28/2019 and she needs a refill on the Protonix sent to [facility].

## 2019-02-12 NOTE — Telephone Encounter (Signed)
Noted. Refill sent. Will see her at rescheduled appt.

## 2019-02-21 NOTE — Progress Notes (Signed)
Patient presents to clinic today for annual exam.  Patient is fasting for labs.  Acute Concerns: Needs school forms completed.   Chronic Issues: Anxiety -- Patient is currently on a regimen of Fluoxetine 20 mg daily. Endorses taking medications as directed. Notes some increase in anxiety recently. 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 Denies any change to appetite or sleep with this. Denies depressed mood or anhedonia.  Still feels like medication is working very well.   GERD - Is taking her Pantoprazole daily with good relief of symptoms. Denies being able to wean off the medication without significant reflux.  Denies breakthrough symptoms with medication.   Health Maintenance: Immunizations -- Tetanus up-to-date.  PAP -- up-to-date. Will need records   Past Medical History:  Diagnosis Date  . Allergy   . Anxiety     Past Surgical History:  Procedure Laterality Date  . TONSILLECTOMY AND ADENOIDECTOMY    . WISDOM TOOTH EXTRACTION      Current Outpatient Medications on File Prior to Visit  Medication Sig Dispense Refill  . FLUoxetine (PROZAC) 20 MG tablet Take 1 tablet (20 mg total) by mouth daily. 30 tablet 3  . Norgestimate-Ethinyl Estradiol Triphasic 0.18/0.215/0.25 MG-25 MCG tab Take 1 tablet by mouth daily. 84 tablet 0  . pantoprazole (PROTONIX) 40 MG tablet Take 1 tablet (40 mg total) by mouth daily. 90 tablet 0   No current facility-administered medications on file prior to visit.     No Known Allergies  Family History  Problem Relation Age of Onset  . Cancer Mother        Breast, Thyroid  . Asthma Brother   . Cancer Maternal Grandmother   . Diabetes Maternal Grandfather   . Heart disease Maternal Grandfather   . Diabetes Paternal Grandmother   . Cirrhosis Paternal Grandfather     Social History   Socioeconomic History  . Marital status: Married    Spouse name: Not on file  . Number of children: Not on file  . Years of education: Not on file  .  Highest education level: Not on file  Occupational History  . Not on file  Social Needs  . Financial resource strain: Not on file  . Food insecurity    Worry: Not on file    Inability: Not on file  . Transportation needs    Medical: Not on file    Non-medical: Not on file  Tobacco Use  . Smoking status: Former Smoker    Packs/day: 0.10    Start date: 08/13/2013    Quit date: 07/12/2014    Years since quitting: 4.6  . Smokeless tobacco: Never Used  Substance and Sexual Activity  . Alcohol use: No    Alcohol/week: 0.0 standard drinks  . Drug use: No  . Sexual activity: Yes    Birth control/protection: None  Lifestyle  . Physical activity    Days per week: Not on file    Minutes per session: Not on file  . Stress: Not on file  Relationships  . Social Musicianconnections    Talks on phone: Not on file    Gets together: Not on file    Attends religious service: Not on file    Active member of club or organization: Not on file    Attends meetings of clubs or organizations: Not on file    Relationship status: Not on file  . Intimate partner violence    Fear of current or ex partner: Not  on file    Emotionally abused: Not on file    Physically abused: Not on file    Forced sexual activity: Not on file  Other Topics Concern  . Not on file  Social History Narrative  . Not on file   Review of Systems  Constitutional: Negative for fever and weight loss.  HENT: Negative for ear discharge, ear pain, hearing loss and tinnitus.   Eyes: Negative for blurred vision, double vision, photophobia and pain.  Respiratory: Negative for cough and shortness of breath.   Cardiovascular: Negative for chest pain and palpitations.  Gastrointestinal: Negative for abdominal pain, blood in stool, constipation, diarrhea, heartburn, melena, nausea and vomiting.  Genitourinary: Negative for dysuria, flank pain, frequency, hematuria and urgency.  Musculoskeletal: Negative for falls.  Neurological: Negative  for dizziness, loss of consciousness and headaches.  Endo/Heme/Allergies: Negative for environmental allergies.  Psychiatric/Behavioral: Negative for depression, hallucinations, substance abuse and suicidal ideas. The patient is not nervous/anxious and does not have insomnia.    BP 120/84   Pulse 95   Temp 98.7 F (37.1 C) (Skin)   Resp 16   Ht 5\' 4"  (1.626 m)   Wt 234 lb (106.1 kg)   SpO2 99%   BMI 40.17 kg/m   Physical Exam Vitals signs reviewed.  HENT:     Head: Normocephalic and atraumatic.     Right Ear: Tympanic membrane, ear canal and external ear normal.     Left Ear: Tympanic membrane, ear canal and external ear normal.     Nose: Nose normal. No mucosal edema.     Mouth/Throat:     Pharynx: Uvula midline. No oropharyngeal exudate or posterior oropharyngeal erythema.  Eyes:     Conjunctiva/sclera: Conjunctivae normal.     Pupils: Pupils are equal, round, and reactive to light.  Neck:     Musculoskeletal: Neck supple.     Thyroid: No thyromegaly.  Cardiovascular:     Rate and Rhythm: Normal rate and regular rhythm.     Heart sounds: Normal heart sounds.  Pulmonary:     Effort: Pulmonary effort is normal. No respiratory distress.     Breath sounds: Normal breath sounds. No wheezing or rales.  Abdominal:     General: Bowel sounds are normal. There is no distension.     Palpations: Abdomen is soft. There is no mass.     Tenderness: There is no abdominal tenderness. There is no guarding or rebound.  Lymphadenopathy:     Cervical: No cervical adenopathy.  Skin:    General: Skin is warm and dry.     Findings: No rash.  Neurological:     Mental Status: She is alert and oriented to person, place, and time.     Cranial Nerves: No cranial nerve deficit.    Assessment/Plan: 1. Visit for preventive health examination Depression screen negative. Health Maintenance reviewed. PAP UTD. Getting records from GYN so results can be abstracted into EMR Preventive schedule  discussed and handout given in AVS. Will obtain fasting labs today.  - CBC with Differential/Platelet - Comprehensive metabolic panel - TSH  2. Morbid obesity ([place]) Body mass index is 40.17 kg/m. Labs today. Dietary and exercise recommendations reviewed with patient.   3. Pre-diabetes Repeat labs today. - Hemoglobin A1c - Lipid panel  4. Anxiety Stable. Continue current regimen of Fluoxetine 20 mg daily.  5. Gastroesophageal reflux disease without esophagitis Stable. Continue current regimen. Continue GERD diet and working on weight loss.    Leeanne Rio, PA-C

## 2019-02-21 NOTE — Patient Instructions (Signed)
Please go to the lab for blood work.   Our office will call you with your results unless you have chosen to receive results via MyChart.  If your blood work is normal we will follow-up each year for physicals and as scheduled for chronic medical problems.  If anything is abnormal we will treat accordingly and get you in for a follow-up.  Good luck with school!   Preventive Care 71-25 Years Old, Female Preventive care refers to visits with your health care provider and lifestyle choices that can promote health and wellness. This includes:  A yearly physical exam. This may also be called an annual well check.  Regular dental visits and eye exams.  Immunizations.  Screening for certain conditions.  Healthy lifestyle choices, such as eating a healthy diet, getting regular exercise, not using drugs or products that contain nicotine and tobacco, and limiting alcohol use. What can I expect for my preventive care visit? Physical exam Your health care provider will check your:  Height and weight. This may be used to calculate body mass index (BMI), which tells if you are at a healthy weight.  Heart rate and blood pressure.  Skin for abnormal spots. Counseling Your health care provider may ask you questions about your:  Alcohol, tobacco, and drug use.  Emotional well-being.  Home and relationship well-being.  Sexual activity.  Eating habits.  Work and work Statistician.  Method of birth control.  Menstrual cycle.  Pregnancy history. What immunizations do I need?  Influenza (flu) vaccine  This is recommended every year. Tetanus, diphtheria, and pertussis (Tdap) vaccine  You may need a Td booster every 10 years. Varicella (chickenpox) vaccine  You may need this if you have not been vaccinated. Human papillomavirus (HPV) vaccine  If recommended by your health care provider, you may need three doses over 6 months. Measles, mumps, and rubella (MMR) vaccine  You may  need at least one dose of MMR. You may also need a second dose. Meningococcal conjugate (MenACWY) vaccine  One dose is recommended if you are age 44-21 years and a first-year college student living in a residence hall, or if you have one of several medical conditions. You may also need additional booster doses. Pneumococcal conjugate (PCV13) vaccine  You may need this if you have certain conditions and were not previously vaccinated. Pneumococcal polysaccharide (PPSV23) vaccine  You may need one or two doses if you smoke cigarettes or if you have certain conditions. Hepatitis A vaccine  You may need this if you have certain conditions or if you travel or work in places where you may be exposed to hepatitis A. Hepatitis B vaccine  You may need this if you have certain conditions or if you travel or work in places where you may be exposed to hepatitis B. Haemophilus influenzae type b (Hib) vaccine  You may need this if you have certain conditions. You may receive vaccines as individual doses or as more than one vaccine together in one shot (combination vaccines). Talk with your health care provider about the risks and benefits of combination vaccines. What tests do I need?  Blood tests  Lipid and cholesterol levels. These may be checked every 5 years starting at age 1.  Hepatitis C test.  Hepatitis B test. Screening  Diabetes screening. This is done by checking your blood sugar (glucose) after you have not eaten for a while (fasting).  Sexually transmitted disease (STD) testing.  BRCA-related cancer screening. This may be done if you  have a family history of breast, ovarian, tubal, or peritoneal cancers.  Pelvic exam and Pap test. This may be done every 3 years starting at age 51. Starting at age 28, this may be done every 5 years if you have a Pap test in combination with an HPV test. Talk with your health care provider about your test results, treatment options, and if  necessary, the need for more tests. Follow these instructions at home: Eating and drinking   Eat a diet that includes fresh fruits and vegetables, whole grains, lean protein, and low-fat dairy.  Take vitamin and mineral supplements as recommended by your health care provider.  Do not drink alcohol if: ? Your health care provider tells you not to drink. ? You are pregnant, may be pregnant, or are planning to become pregnant.  If you drink alcohol: ? Limit how much you have to 0-1 drink a day. ? Be aware of how much alcohol is in your drink. In the [place] one drink equals one 12 oz bottle of beer (355 mL), one 5 oz glass of wine (148 mL), or one 1 oz glass of hard liquor (44 mL). Lifestyle  Take daily care of your teeth and gums.  Stay active. Exercise for at least 30 minutes on 5 or more days each week.  Do not use any products that contain nicotine or tobacco, such as cigarettes, e-cigarettes, and chewing tobacco. If you need help quitting, ask your health care provider.  If you are sexually active, practice safe sex. Use a condom or other form of birth control (contraception) in order to prevent pregnancy and STIs (sexually transmitted infections). If you plan to become pregnant, see your health care provider for a preconception visit. What's next?  Visit your health care provider once a year for a well check visit.  Ask your health care provider how often you should have your eyes and teeth checked.  Stay up to date on all vaccines. This information is not intended to replace advice given to you by your health care provider. Make sure you discuss any questions you have with your health care provider. Document Released: 08/31/2001 Document Revised: 03/16/2018 Document Reviewed: 03/16/2018 Elsevier Patient Education  2020 [employer].

## 2019-02-22 ENCOUNTER — Other Ambulatory Visit: Payer: Self-pay

## 2019-02-22 ENCOUNTER — Encounter: Payer: Self-pay | Admitting: Physician Assistant

## 2019-02-22 ENCOUNTER — Ambulatory Visit (INDEPENDENT_AMBULATORY_CARE_PROVIDER_SITE_OTHER): Payer: 59 | Admitting: Physician Assistant

## 2019-02-22 ENCOUNTER — Telehealth: Payer: Self-pay | Admitting: Physician Assistant

## 2019-02-22 VITALS — BP 120/84 | HR 95 | Temp 98.7°F | Resp 16 | Ht 64.0 in | Wt 234.0 lb

## 2019-02-22 DIAGNOSIS — Z0000 Encounter for general adult medical examination without abnormal findings: Secondary | ICD-10-CM | POA: Diagnosis not present

## 2019-02-22 DIAGNOSIS — R7303 Prediabetes: Secondary | ICD-10-CM | POA: Diagnosis not present

## 2019-02-22 DIAGNOSIS — K219 Gastro-esophageal reflux disease without esophagitis: Secondary | ICD-10-CM | POA: Diagnosis not present

## 2019-02-22 DIAGNOSIS — F419 Anxiety disorder, unspecified: Secondary | ICD-10-CM | POA: Diagnosis not present

## 2019-02-22 LAB — LIPID PANEL
Cholesterol: 218 mg/dL — ABNORMAL HIGH (ref 0–200)
HDL: 38.3 mg/dL — ABNORMAL LOW (ref >39.00)
LDL Cholesterol: 145 mg/dL — ABNORMAL HIGH (ref 0–99)
NonHDL: 179.98
Total CHOL/HDL Ratio: 6
Triglycerides: 176 mg/dL — ABNORMAL HIGH (ref 0.0–149.0)
VLDL: 35.2 mg/dL (ref 0.0–40.0)

## 2019-02-22 LAB — CBC WITH DIFFERENTIAL/PLATELET
Basophils Absolute: 0.1 10*3/uL (ref 0.0–0.1)
Basophils Relative: 1.3 % (ref 0.0–3.0)
Eosinophils Absolute: 0.2 10*3/uL (ref 0.0–0.7)
Eosinophils Relative: 1.8 % (ref 0.0–5.0)
HCT: 34.7 % — ABNORMAL LOW (ref 36.0–46.0)
Hemoglobin: 10.9 g/dL — ABNORMAL LOW (ref 12.0–15.0)
Lymphocytes Relative: 26 % (ref 12.0–46.0)
Lymphs Abs: 2.3 10*3/uL (ref 0.7–4.0)
MCHC: 31.4 g/dL (ref 30.0–36.0)
MCV: 73.4 fl — ABNORMAL LOW (ref 78.0–100.0)
Monocytes Absolute: 0.5 10*3/uL (ref 0.1–1.0)
Monocytes Relative: 5.6 % (ref 3.0–12.0)
Neutro Abs: 5.8 10*3/uL (ref 1.4–7.7)
Neutrophils Relative %: 65.3 % (ref 43.0–77.0)
Platelets: 416 10*3/uL — ABNORMAL HIGH (ref 150.0–400.0)
RBC: 4.72 Mil/uL (ref 3.87–5.11)
RDW: 16.3 % — ABNORMAL HIGH (ref 11.5–15.5)
WBC: 8.9 10*3/uL (ref 4.0–10.5)

## 2019-02-22 LAB — COMPREHENSIVE METABOLIC PANEL
ALT: 19 U/L (ref 0–35)
AST: 13 U/L (ref 0–37)
Albumin: 4.2 g/dL (ref 3.5–5.2)
Alkaline Phosphatase: 74 U/L (ref 39–117)
BUN: 15 mg/dL (ref 6–23)
CO2: 24 mEq/L (ref 19–32)
Calcium: 9.5 mg/dL (ref 8.4–10.5)
Chloride: 105 mEq/L (ref 96–112)
Creatinine, Ser: 0.61 mg/dL (ref 0.40–1.20)
GFR: 119.47 mL/min (ref >60.00)
Glucose, Bld: 109 mg/dL — ABNORMAL HIGH (ref 70–99)
Potassium: 4 mEq/L (ref 3.5–5.1)
Sodium: 137 mEq/L (ref 135–145)
Total Bilirubin: 0.2 mg/dL (ref 0.2–1.2)
Total Protein: 6.7 g/dL (ref 6.0–8.3)

## 2019-02-22 LAB — HEMOGLOBIN A1C: Hgb A1c MFr Bld: 6.4 % (ref 4.6–6.5)

## 2019-02-22 LAB — TSH: TSH: 1.45 u[IU]/mL (ref 0.35–4.50)

## 2019-02-22 MED ORDER — PANTOPRAZOLE SODIUM 40 MG PO TBEC: 40.0000 mg | DELAYED_RELEASE_TABLET | Freq: Every day | ORAL | 1 refills | Status: DC

## 2019-02-22 NOTE — Telephone Encounter (Signed)
Pt called in to request to have her Rx for pantoprazole (PROTONIX) 40 MG tablet sent in to a different pharmacy.   Pt is requesting it be sen to the [facility] on file instead.

## 2019-02-22 NOTE — Telephone Encounter (Signed)
Please advise 

## 2019-02-22 NOTE — Telephone Encounter (Signed)
Rx sent to the [facility] pharmacy per patient request

## 2019-02-23 ENCOUNTER — Other Ambulatory Visit (INDEPENDENT_AMBULATORY_CARE_PROVIDER_SITE_OTHER): Payer: 59

## 2019-02-23 ENCOUNTER — Ambulatory Visit: Payer: BC Managed Care – PPO | Admitting: Physician Assistant

## 2019-02-23 DIAGNOSIS — D649 Anemia, unspecified: Secondary | ICD-10-CM | POA: Diagnosis not present

## 2019-02-26 LAB — IBC PANEL
Iron: 3 ug/dL — ABNORMAL LOW (ref 42–145)
Saturation Ratios: 0.7 % — ABNORMAL LOW (ref 20.0–50.0)
Transferrin: 301 mg/dL (ref 212.0–360.0)

## 2019-02-27 ENCOUNTER — Other Ambulatory Visit: Payer: Self-pay | Admitting: Emergency Medicine

## 2019-02-27 DIAGNOSIS — D509 Iron deficiency anemia, unspecified: Secondary | ICD-10-CM

## 2019-02-27 MED ORDER — FERROUS SULFATE 325 (65 FE) MG PO TBEC: 325.0000 mg | DELAYED_RELEASE_TABLET | Freq: Every day | ORAL | 3 refills | Status: DC

## 2019-02-28 ENCOUNTER — Ambulatory Visit: Payer: BC Managed Care – PPO | Admitting: Physician Assistant

## 2019-03-23 ENCOUNTER — Encounter: Payer: Self-pay | Admitting: Physician Assistant

## 2019-03-23 MED ORDER — ALPRAZOLAM 0.5 MG PO TABS: 0.5000 mg | ORAL_TABLET | Freq: Two times a day (BID) | ORAL | 0 refills | Status: DC | PRN

## 2019-03-30 MED ORDER — FLUOXETINE HCL 40 MG PO CAPS: 40.0000 mg | ORAL_CAPSULE | Freq: Every day | ORAL | 3 refills | Status: DC

## 2019-03-30 NOTE — Addendum Note (Signed)
Addended by: Brunetta Jeans on: 03/30/2019 06:12 AM   Modules accepted: Orders

## 2019-04-08 IMAGING — DX DG CHEST 1V PORT
1 series · 1 of 1 positions shown · non-contrast
Comparison: Chest x-ray of 07/29/2012

CLINICAL DATA: Chest pain, shortness of breath

EXAM:
PORTABLE CHEST 1 VIEW

[chest ap]
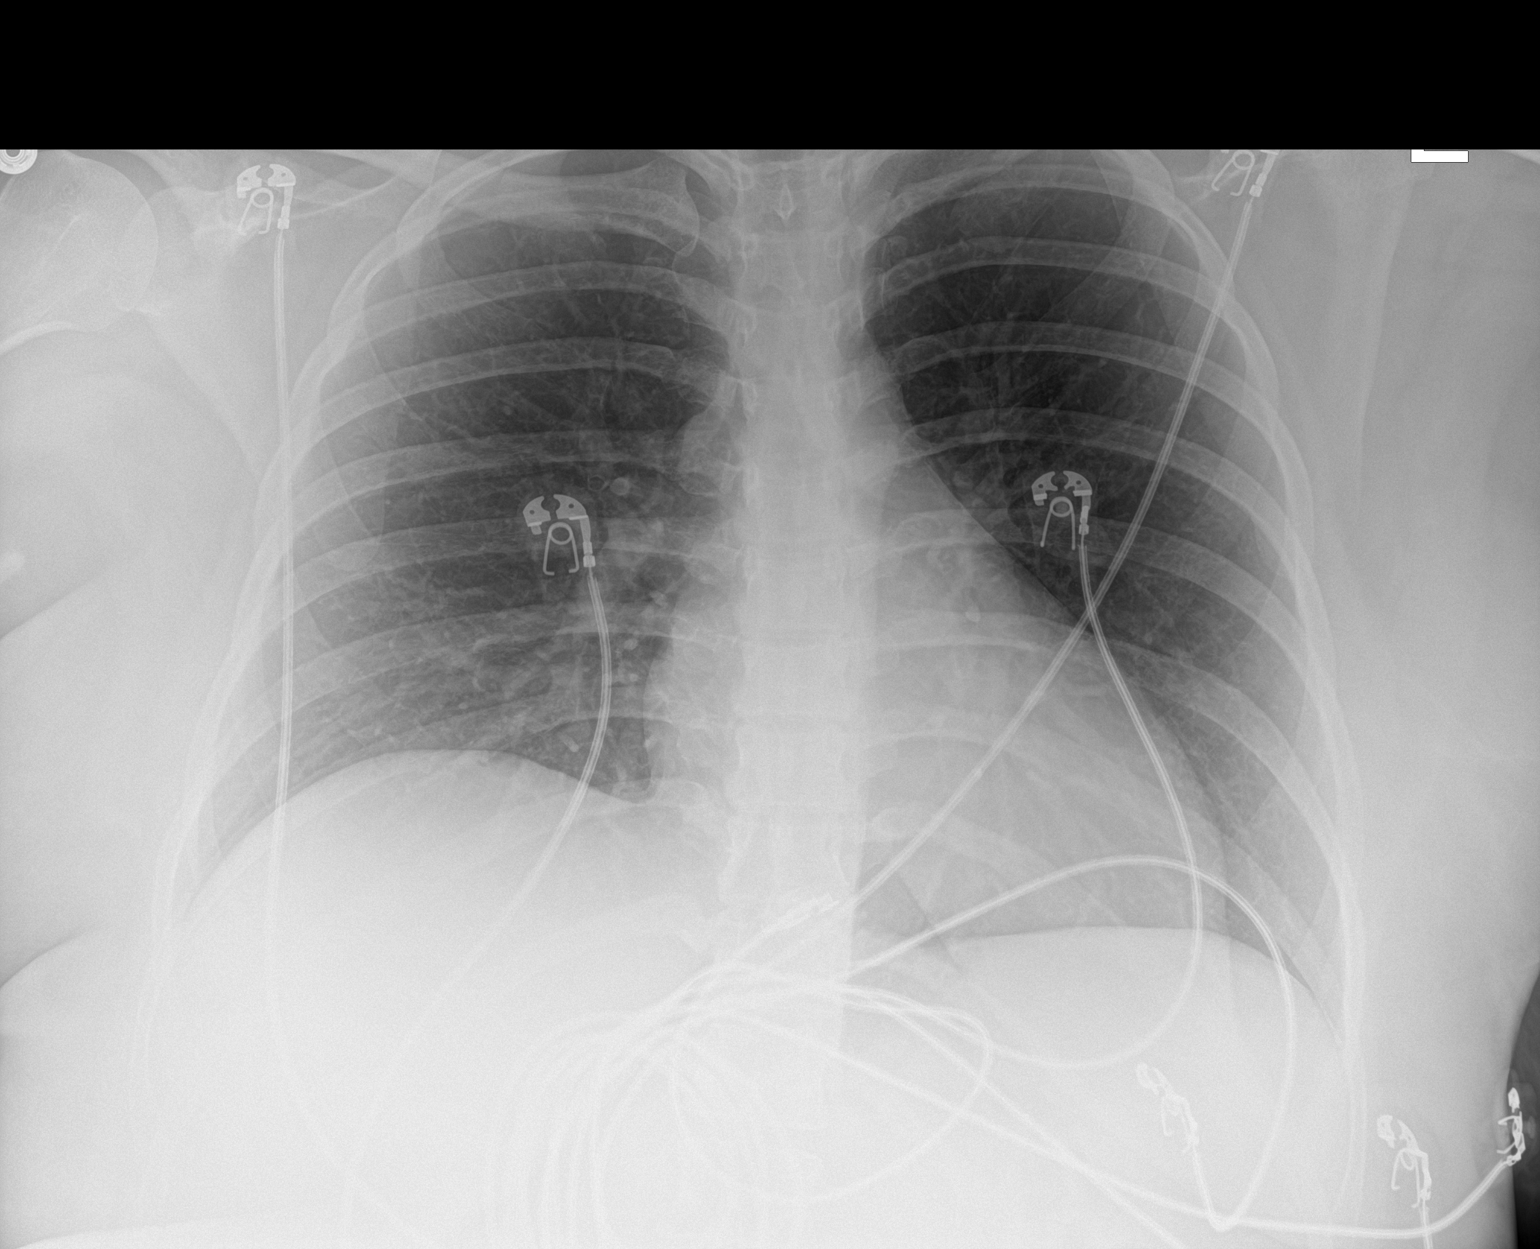

[1 of 1 positions shown; findings below may reference images not displayed]

FINDINGS: No active infiltrate or effusion is seen. Slight elevation of the
right hemidiaphragm remains. Mediastinal and hilar contours are
unchanged and the heart is within normal limits in size. No bony
abnormality is seen.
IMPRESSION: Stable chest x-ray with slight elevation of the right hemidiaphragm.
No active lung disease.

## 2019-04-23 ENCOUNTER — Encounter: Payer: Self-pay | Admitting: Physician Assistant

## 2019-04-23 ENCOUNTER — Other Ambulatory Visit: Payer: Self-pay

## 2019-04-23 ENCOUNTER — Ambulatory Visit (INDEPENDENT_AMBULATORY_CARE_PROVIDER_SITE_OTHER): Payer: 59 | Admitting: Physician Assistant

## 2019-04-23 VITALS — HR 86 | Temp 98.7°F

## 2019-04-23 DIAGNOSIS — B9689 Other specified bacterial agents as the cause of diseases classified elsewhere: Secondary | ICD-10-CM | POA: Diagnosis not present

## 2019-04-23 DIAGNOSIS — J019 Acute sinusitis, unspecified: Secondary | ICD-10-CM

## 2019-04-23 MED ORDER — AMOXICILLIN-POT CLAVULANATE 875-125 MG PO TABS: 1.0000 | ORAL_TABLET | Freq: Two times a day (BID) | ORAL | 0 refills | Status: DC

## 2019-04-23 MED ORDER — FLUCONAZOLE 150 MG PO TABS: 150.0000 mg | ORAL_TABLET | Freq: Once | ORAL | 0 refills | Status: AC

## 2019-04-23 NOTE — Patient Instructions (Signed)
Instructions sent to MyChart.   Please take antibiotic as directed.  Increase fluid intake.  Use Saline nasal spray.  Take a daily multivitamin. Delsym if needed for cough. Make sure to start a probiotic. The Diflucan is in case of yeast from antibiotic.  Place a humidifier in the bedroom.  Please call or return clinic if symptoms are not improving.  For work, make sure you are feeling better before you return.  If no fever reoccurring and no development of cough then you can return Wednesday. I am writing you out for days missed and to include Wed if needed.   Sinusitis Sinusitis is redness, soreness, and swelling (inflammation) of the paranasal sinuses. Paranasal sinuses are air pockets within the bones of your face (beneath the eyes, the middle of the forehead, or above the eyes). In healthy paranasal sinuses, mucus is able to drain out, and air is able to circulate through them by way of your nose. However, when your paranasal sinuses are inflamed, mucus and air can become trapped. This can allow bacteria and other germs to grow and cause infection. Sinusitis can develop quickly and last only a short time (acute) or continue over a long period (chronic). Sinusitis that lasts for more than 12 weeks is considered chronic.  CAUSES  Causes of sinusitis include:  Allergies.  Structural abnormalities, such as displacement of the cartilage that separates your nostrils (deviated septum), which can decrease the air flow through your nose and sinuses and affect sinus drainage.  Functional abnormalities, such as when the small hairs (cilia) that line your sinuses and help remove mucus do not work properly or are not present. SYMPTOMS  Symptoms of acute and chronic sinusitis are the same. The primary symptoms are pain and pressure around the affected sinuses. Other symptoms include:  Upper toothache.  Earache.  Headache.  Bad breath.  Decreased sense of smell and taste.  A cough, which  worsens when you are lying flat.  Fatigue.  Fever.  Thick drainage from your nose, which often is green and may contain pus (purulent).  Swelling and warmth over the affected sinuses. DIAGNOSIS  Your caregiver will perform a physical exam. During the exam, your caregiver may:  Look in your nose for signs of abnormal growths in your nostrils (nasal polyps).  Tap over the affected sinus to check for signs of infection.  View the inside of your sinuses (endoscopy) with a special imaging device with a light attached (endoscope), which is inserted into your sinuses. If your caregiver suspects that you have chronic sinusitis, one or more of the following tests may be recommended:  Allergy tests.  Nasal culture A sample of mucus is taken from your nose and sent to a lab and screened for bacteria.  Nasal cytology A sample of mucus is taken from your nose and examined by your caregiver to determine if your sinusitis is related to an allergy. TREATMENT  Most cases of acute sinusitis are related to a viral infection and will resolve on their own within 10 days. Sometimes medicines are prescribed to help relieve symptoms (pain medicine, decongestants, nasal steroid sprays, or saline sprays).  However, for sinusitis related to a bacterial infection, your caregiver will prescribe antibiotic medicines. These are medicines that will help kill the bacteria causing the infection.  Rarely, sinusitis is caused by a fungal infection. In theses cases, your caregiver will prescribe antifungal medicine. For some cases of chronic sinusitis, surgery is needed. Generally, these are cases in which sinusitis recurs  more than 3 times per year, despite other treatments. HOME CARE INSTRUCTIONS   Drink plenty of water. Water helps thin the mucus so your sinuses can drain more easily.  Use a humidifier.  Inhale steam 3 to 4 times a day (for example, sit in the bathroom with the shower running).  Apply a warm,  moist washcloth to your face 3 to 4 times a day, or as directed by your caregiver.  Use saline nasal sprays to help moisten and clean your sinuses.  Take over-the-counter or prescription medicines for pain, discomfort, or fever only as directed by your caregiver. SEEK IMMEDIATE MEDICAL CARE IF:  You have increasing pain or severe headaches.  You have nausea, vomiting, or drowsiness.  You have swelling around your face.  You have vision problems.  You have a stiff neck.  You have difficulty breathing. MAKE SURE YOU:   Understand these instructions.  Will watch your condition.  Will get help right away if you are not doing well or get worse. Document Released: 07/05/2005 Document Revised: 09/27/2011 Document Reviewed: 07/20/2011 [facility] Patient Information 2014 [place], [place].

## 2019-04-23 NOTE — Progress Notes (Signed)
I have discussed the procedure for the virtual visit with the patient who has given consent to proceed with assessment and treatment.   Braxten Memmer S Miaa Latterell, CMA     

## 2019-04-23 NOTE — Progress Notes (Signed)
   Virtual Visit via Video   I connected with patient on 04/23/19 at  4:15 PM EDT by a video enabled telemedicine application and verified that I am speaking with the correct person using two identifiers.  Location patient: Home Location provider: [facility], Office Persons participating in the virtual visit: Patient, Provider, [place] (Patina Moore)  I discussed the limitations of evaluation and management by telemedicine and the availability of in person appointments. The patient expressed understanding and agreed to proceed.  Subjective:   HPI:   Patient presents via Doxy[facility]Me today c/o 1 week of nasal congestin, sinus pressure and frontal sinus pain. Had a couple days of fever with Tmax 101.7 that had resolved after the first few days. Notes occasional mild, dry cough. Notes some facial pain and ear pain but intermittent (R>L). Negative COVID test. Denies change in taste or smell. Denies GI symptoms. Denies chest congestion, chest pain or SOB. Denies recent travel or sick contact. Has been around a coworker with COVID but again testing negative.  ROS:   See pertinent positives and negatives per HPI.  Patient Active Problem List   Diagnosis Date Noted  . Pre-diabetes 11/15/2018  . Gastroesophageal reflux disease without esophagitis 11/15/2018  . Morbid obesity ([place]) 11/15/2018  . Infertility, anovulation 08/30/2016  . Anovulation 08/09/2016    Social History   Tobacco Use  . Smoking status: Former Smoker    Packs/day: 0.10    Start date: 08/13/2013    Quit date: 07/12/2014    Years since quitting: 4.7  . Smokeless tobacco: Never Used  Substance Use Topics  . Alcohol use: No    Alcohol/week: 0.0 standard drinks    Current Outpatient Medications:  .  ALPRAZolam (XANAX) 0.5 MG tablet, Take 1 tablet (0.5 mg total) by mouth 2 (two) times daily as needed for anxiety., Disp: 10 tablet, Rfl: 0 .  ferrous sulfate 325 (65 FE) MG EC tablet, Take 1 tablet (325 mg total) by mouth  daily with breakfast., Disp: 30 tablet, Rfl: 3 .  FLUoxetine (PROZAC) 40 MG capsule, Take 1 capsule (40 mg total) by mouth daily., Disp: 30 capsule, Rfl: 3 .  Norgestimate-Ethinyl Estradiol Triphasic 0.18/0.215/0.25 MG-25 MCG tab, Take 1 tablet by mouth daily., Disp: 84 tablet, Rfl: 0 .  pantoprazole (PROTONIX) 40 MG tablet, Take 1 tablet (40 mg total) by mouth daily., Disp: 90 tablet, Rfl: 1  No Known Allergies  Objective:   Pulse 86   Temp 98.7 F (37.1 C) (Oral)   SpO2 99%   Patient is well-developed, well-nourished in no acute distress.  Resting comfortably at home.  Head is normocephalic, atraumatic.  No labored breathing.  Speech is clear and coherent with logical content.  Patient is alert and oriented at baseline.   Assessment and Plan:   1. Acute bacterial sinusitis Rx Augmentin.  Increase fluids.  Rest.  Saline nasal spray.  Probiotic.  Mucinex as directed.  Humidifier in bedroom. Diflucan to prevent yeast (patient has history of this with abx).  Call or return to clinic if symptoms are not improving.  - amoxicillin-clavulanate (AUGMENTIN) 875-125 MG tablet; Take 1 tablet by mouth 2 (two) times daily.  Dispense: 14 tablet; Refill: 0    Sonya Santiago, [place] 04/23/2019

## 2019-04-30 ENCOUNTER — Other Ambulatory Visit: Payer: Self-pay | Admitting: Physician Assistant

## 2019-05-01 NOTE — Telephone Encounter (Signed)
Last refill: 9.4.20 #10, 0 Last OV: 10.5.20 dx. Acute sinus infection

## 2019-05-14 ENCOUNTER — Encounter: Payer: Self-pay | Admitting: Physician Assistant

## 2019-05-14 MED ORDER — ALPRAZOLAM 0.5 MG PO TABS: ORAL_TABLET | ORAL | 1 refills | Status: DC

## 2019-05-25 ENCOUNTER — Other Ambulatory Visit: Payer: Self-pay

## 2019-05-25 ENCOUNTER — Telehealth: Payer: Self-pay | Admitting: Physician Assistant

## 2019-05-25 NOTE — Telephone Encounter (Signed)
Patient called in stating that all pharmacies she has contacted in [place] area are back ordered until the end of the month for her Xanax 0.5 mg script. Verified that [facility] in [place] has med in stock.

## 2019-05-25 NOTE — Telephone Encounter (Signed)
Error

## 2019-05-28 MED ORDER — ALPRAZOLAM 0.5 MG PO TABS: ORAL_TABLET | ORAL | 1 refills | Status: DC

## 2019-05-28 NOTE — Telephone Encounter (Signed)
Patient is aware the script was sent in to [facility]

## 2019-07-03 ENCOUNTER — Other Ambulatory Visit: Payer: Self-pay | Admitting: Physician Assistant

## 2019-07-03 DIAGNOSIS — D509 Iron deficiency anemia, unspecified: Secondary | ICD-10-CM

## 2019-07-06 ENCOUNTER — Encounter: Payer: Self-pay | Admitting: Physician Assistant

## 2019-07-06 ENCOUNTER — Other Ambulatory Visit: Payer: Self-pay

## 2019-07-06 ENCOUNTER — Ambulatory Visit (INDEPENDENT_AMBULATORY_CARE_PROVIDER_SITE_OTHER): Payer: 59 | Admitting: Physician Assistant

## 2019-07-06 VITALS — Temp 98.7°F | Wt 240.0 lb

## 2019-07-06 DIAGNOSIS — J0100 Acute maxillary sinusitis, unspecified: Secondary | ICD-10-CM

## 2019-07-06 NOTE — Progress Notes (Signed)
   Virtual Visit via Video   I connected with patient on 07/06/19 at  9:00 AM EST by a video enabled telemedicine application and verified that I am speaking with the correct person using two identifiers.  Location patient: Home Location provider: [facility], Office Persons participating in the virtual visit: Patient, Provider, [place] (Patina Moore)  I discussed the limitations of evaluation and management by telemedicine and the availability of in person appointments. The patient expressed understanding and agreed to proceed.  Subjective:   HPI:        Patient presents via Doxy.Me today c/o sinus pressure, sinus headache, sore throat and now with sinus pain, ear pressure and maxillary pain. Denies fever, chills, body aches, shortness of breath or chest pain. Denies change in taste or smell. Denies nausea, vomiting, diarrhea. 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   ROS:   See pertinent positives and negatives per HPI.  Patient Active Problem List   Diagnosis Date Noted  . Pre-diabetes 11/15/2018  . Gastroesophageal reflux disease without esophagitis 11/15/2018  . Morbid obesity ([place]) 11/15/2018  . Infertility, anovulation 08/30/2016  . Anovulation 08/09/2016    Social History   Tobacco Use  . Smoking status: Former Smoker    Packs/day: 0.10    Start date: 08/13/2013    Quit date: 07/12/2014    Years since quitting: 4.9  . Smokeless tobacco: Never Used  Substance Use Topics  . Alcohol use: No    Alcohol/week: 0.0 standard drinks    Current Outpatient Medications:  .  ALPRAZolam (XANAX) 0.5 MG tablet, TAKE 1 TABLET(0.5 MG) BY MOUTH TWICE DAILY AS NEEDED FOR ANXIETY, Disp: 10 tablet, Rfl: 1 .  FEROSUL 325 (65 Fe) MG tablet, TAKE 1 TABLET(325 MG) BY MOUTH DAILY WITH BREAKFAST, Disp: 90 tablet, Rfl: 1 .  FLUoxetine (PROZAC) 40 MG capsule, Take 1 capsule (40 mg total) by mouth daily., Disp: 30 capsule, Rfl: 3 .  Norgestimate-Ethinyl  Estradiol Triphasic 0.18/0.215/0.25 MG-25 MCG tab, Take 1 tablet by mouth daily., Disp: 84 tablet, Rfl: 0 .  pantoprazole (PROTONIX) 40 MG tablet, Take 1 tablet (40 mg total) by mouth daily., Disp: 90 tablet, Rfl: 1  No Known Allergies  Objective:   Temp 98.7 F (37.1 C) (Temporal)   Wt 240 lb (108.9 kg)   BMI 41.20 kg/m   Patient is well-developed, well-nourished in no acute distress.  Resting comfortably at home.  Head is normocephalic, atraumatic.  No labored breathing.  Speech is clear and coherent with logical content.  Patient is alert and oriented at baseline.   Assessment and Plan:   1. Acute non-recurrent maxillary sinusitis In the [place] where only few days of symptoms so likely viral but developing maxillary sinus pain which is concerning for potential of bacterial infection. Afebrile. Does not seem consistent with COVID but she is to contact [employer] for employment screen. Increase fluids. Get plenty of rest. Start OTC nasal steroid and oral decongestant-antihistamine combination. Will send in Rx Augmentin and a diflucan for her to take if symptoms are not improving or continue to progress despite supportive measures and OTC medications.     Leeanne Rio, PA-C 07/06/2019

## 2019-07-06 NOTE — Progress Notes (Signed)
I have discussed the procedure for the virtual visit with the patient who has given consent to proceed with assessment and treatment.   Trinady Milewski S Arthea Nobel, CMA     

## 2019-07-06 NOTE — Patient Instructions (Signed)
Instructions sent to MyChart.     Increase fluid intake.  Use Saline nasal spray.  Take a daily multivitamin. Start OTC Flonase or Nasacort. Also start a Mucinex Sinus Max to help with congestion and inflammation..  Place a humidifier in the bedroom.    If symptoms are not easing up over the next 28 hours or symptoms continue to progress, start antibiotic and take as directed.  Keep quarantined until feeling better. Although this seems indicative of your normal sinus infections, due to community prevalence of COVID, you need to contact health-at-work to see if they want you to be tested.   Please call or return clinic if symptoms are not improving.  Sinusitis Sinusitis is redness, soreness, and swelling (inflammation) of the paranasal sinuses. Paranasal sinuses are air pockets within the bones of your face (beneath the eyes, the middle of the forehead, or above the eyes). In healthy paranasal sinuses, mucus is able to drain out, and air is able to circulate through them by way of your nose. However, when your paranasal sinuses are inflamed, mucus and air can become trapped. This can allow bacteria and other germs to grow and cause infection. Sinusitis can develop quickly and last only a short time (acute) or continue over a long period (chronic). Sinusitis that lasts for more than 12 weeks is considered chronic.  CAUSES  Causes of sinusitis include:  Allergies.  Structural abnormalities, such as displacement of the cartilage that separates your nostrils (deviated septum), which can decrease the air flow through your nose and sinuses and affect sinus drainage.  Functional abnormalities, such as when the small hairs (cilia) that line your sinuses and help remove mucus do not work properly or are not present. SYMPTOMS  Symptoms of acute and chronic sinusitis are the same. The primary symptoms are pain and pressure around the affected sinuses. Other symptoms include:  Upper  toothache.  Earache.  Headache.  Bad breath.  Decreased sense of smell and taste.  A cough, which worsens when you are lying flat.  Fatigue.  Fever.  Thick drainage from your nose, which often is green and may contain pus (purulent).  Swelling and warmth over the affected sinuses. DIAGNOSIS  Your caregiver will perform a physical exam. During the exam, your caregiver may:  Look in your nose for signs of abnormal growths in your nostrils (nasal polyps).  Tap over the affected sinus to check for signs of infection.  View the inside of your sinuses (endoscopy) with a special imaging device with a light attached (endoscope), which is inserted into your sinuses. If your caregiver suspects that you have chronic sinusitis, one or more of the following tests may be recommended:  Allergy tests.  Nasal culture A sample of mucus is taken from your nose and sent to a lab and screened for bacteria.  Nasal cytology A sample of mucus is taken from your nose and examined by your caregiver to determine if your sinusitis is related to an allergy. TREATMENT  Most cases of acute sinusitis are related to a viral infection and will resolve on their own within 10 days. Sometimes medicines are prescribed to help relieve symptoms (pain medicine, decongestants, nasal steroid sprays, or saline sprays).  However, for sinusitis related to a bacterial infection, your caregiver will prescribe antibiotic medicines. These are medicines that will help kill the bacteria causing the infection.  Rarely, sinusitis is caused by a fungal infection. In theses cases, your caregiver will prescribe antifungal medicine. For some cases of chronic  sinusitis, surgery is needed. Generally, these are cases in which sinusitis recurs more than 3 times per year, despite other treatments. HOME CARE INSTRUCTIONS   Drink plenty of water. Water helps thin the mucus so your sinuses can drain more easily.  Use a  humidifier.  Inhale steam 3 to 4 times a day (for example, sit in the bathroom with the shower running).  Apply a warm, moist washcloth to your face 3 to 4 times a day, or as directed by your caregiver.  Use saline nasal sprays to help moisten and clean your sinuses.  Take over-the-counter or prescription medicines for pain, discomfort, or fever only as directed by your caregiver. SEEK IMMEDIATE MEDICAL CARE IF:  You have increasing pain or severe headaches.  You have nausea, vomiting, or drowsiness.  You have swelling around your face.  You have vision problems.  You have a stiff neck.  You have difficulty breathing. MAKE SURE YOU:   Understand these instructions.  Will watch your condition.  Will get help right away if you are not doing well or get worse. Document Released: 07/05/2005 Document Revised: 09/27/2011 Document Reviewed: 07/20/2011 [facility] Patient Information 2014 [place], [place].

## 2019-07-10 ENCOUNTER — Emergency Department (HOSPITAL_COMMUNITY)
Admission: EM | Admit: 2019-07-10 | Discharge: 2019-07-10 | Disposition: A | Discharge status: Home / Self Care | Payer: 59 | Attending: Emergency Medicine | Admitting: Emergency Medicine

## 2019-07-10 ENCOUNTER — Encounter (HOSPITAL_COMMUNITY): Payer: Self-pay

## 2019-07-10 ENCOUNTER — Other Ambulatory Visit: Payer: Self-pay

## 2019-07-10 DIAGNOSIS — Z20828 Contact with and (suspected) exposure to other viral communicable diseases: Secondary | ICD-10-CM | POA: Insufficient documentation

## 2019-07-10 DIAGNOSIS — B349 Viral infection, unspecified: Secondary | ICD-10-CM | POA: Diagnosis not present

## 2019-07-10 DIAGNOSIS — Z87891 Personal history of nicotine dependence: Secondary | ICD-10-CM | POA: Diagnosis not present

## 2019-07-10 DIAGNOSIS — R519 Headache, unspecified: Secondary | ICD-10-CM | POA: Diagnosis present

## 2019-07-10 LAB — CBC WITH DIFFERENTIAL/PLATELET
Abs Immature Granulocytes: 0.03 10*3/uL (ref 0.00–0.07)
Basophils Absolute: 0 10*3/uL (ref 0.0–0.1)
Basophils Relative: 1 %
Eosinophils Absolute: 0.2 10*3/uL (ref 0.0–0.5)
Eosinophils Relative: 2 %
HCT: 37.8 % (ref 36.0–46.0)
Hemoglobin: 11.4 g/dL — ABNORMAL LOW (ref 12.0–15.0)
Immature Granulocytes: 0 %
Lymphocytes Relative: 28 %
Lymphs Abs: 2.5 10*3/uL (ref 0.7–4.0)
MCH: 24.5 pg — ABNORMAL LOW (ref 26.0–34.0)
MCHC: 30.2 g/dL (ref 30.0–36.0)
MCV: 81.1 fL (ref 80.0–100.0)
Monocytes Absolute: 0.5 10*3/uL (ref 0.1–1.0)
Monocytes Relative: 5 %
Neutro Abs: 5.6 10*3/uL (ref 1.7–7.7)
Neutrophils Relative %: 64 %
Platelets: 326 10*3/uL (ref 150–400)
RBC: 4.66 MIL/uL (ref 3.87–5.11)
RDW: 14.6 % (ref 11.5–15.5)
WBC: 8.8 10*3/uL (ref 4.0–10.5)
nRBC: 0 % (ref 0.0–0.2)

## 2019-07-10 LAB — URINALYSIS, ROUTINE W REFLEX MICROSCOPIC
Bilirubin Urine: NEGATIVE
Glucose, UA: NEGATIVE mg/dL
Hgb urine dipstick: NEGATIVE
Ketones, ur: NEGATIVE mg/dL
Leukocytes,Ua: NEGATIVE
Nitrite: NEGATIVE
Protein, ur: NEGATIVE mg/dL
Specific Gravity, Urine: 1.016 (ref 1.005–1.030)
pH: 5 (ref 5.0–8.0)

## 2019-07-10 LAB — COMPREHENSIVE METABOLIC PANEL
ALT: 48 U/L — ABNORMAL HIGH (ref 0–44)
AST: 43 U/L — ABNORMAL HIGH (ref 15–41)
Albumin: 3.6 g/dL (ref 3.5–5.0)
Alkaline Phosphatase: 76 U/L (ref 38–126)
Anion gap: 9 (ref 5–15)
BUN: 8 mg/dL (ref 6–20)
CO2: 22 mmol/L (ref 22–32)
Calcium: 8.6 mg/dL — ABNORMAL LOW (ref 8.9–10.3)
Chloride: 105 mmol/L (ref 98–111)
Creatinine, Ser: 0.69 mg/dL (ref 0.44–1.00)
GFR calc Af Amer: >60 mL/min (ref >60)
GFR calc non Af Amer: >60 mL/min (ref >60)
Glucose, Bld: 105 mg/dL — ABNORMAL HIGH (ref 70–99)
Potassium: 4.7 mmol/L (ref 3.5–5.1)
Sodium: 136 mmol/L (ref 135–145)
Total Bilirubin: 1.3 mg/dL — ABNORMAL HIGH (ref 0.3–1.2)
Total Protein: 6.9 g/dL (ref 6.5–8.1)

## 2019-07-10 LAB — PREGNANCY, URINE: Preg Test, Ur: NEGATIVE

## 2019-07-10 MED ORDER — ONDANSETRON HCL 4 MG/2ML IJ SOLN
4.0000 mg | Freq: Once | INTRAMUSCULAR | Status: AC
  Administered 2019-07-10: 4 mg via INTRAVENOUS
  Filled 2019-07-10: qty 2

## 2019-07-10 MED ORDER — ONDANSETRON 4 MG PO TBDP: 4.0000 mg | ORAL_TABLET | Freq: Three times a day (TID) | ORAL | 0 refills | Status: DC | PRN

## 2019-07-10 MED ORDER — SODIUM CHLORIDE 0.9 % IV BOLUS
1000.0000 mL | Freq: Once | INTRAVENOUS | Status: AC
  Administered 2019-07-10: 1000 mL via INTRAVENOUS

## 2019-07-10 MED ORDER — BENZONATATE 100 MG PO CAPS: 100.0000 mg | ORAL_CAPSULE | Freq: Three times a day (TID) | ORAL | 0 refills | Status: DC

## 2019-07-10 NOTE — ED Provider Notes (Addendum)
[facility] EMERGENCY DEPARTMENT Provider Note   CSN: [id number] Arrival date & time: 07/10/19  [id number]     History Chief Complaint  Patient presents with  . Possible COVID    Sonya Santiago is a 23 y.o. female with a history of anxiety, GERD, obesity, and former tobacco use who presents to the emergency department with concern for possible COVID 19 with feeling poorly for the past 1 week. Patient reports sxs of gradual onset headaches with steady progression, nasal congestion, mild dry cough, loss of taste/smell, body aches, fever to 101, poor appetite, and inability to keep fluids down with several episodes of emesis. No alleviating/aggravating factors. Patient's friend whom has similar sxs and that she has spent time with just tested positive for COVID 19. She was tested yesterday and is pending results. Denies dyspnea, chest pain, abdominal pain, hematemesis, melena, dysuria, vaginal bleeding, or vaginal discharge.   HPI     Past Medical History:  Diagnosis Date  . Allergy   . Anxiety     Patient Active Problem List   Diagnosis Date Noted  . Pre-diabetes 11/15/2018  . Gastroesophageal reflux disease without esophagitis 11/15/2018  . Morbid obesity (HCC) 11/15/2018  . Infertility, anovulation 08/30/2016  . Anovulation 08/09/2016    Past Surgical History:  Procedure Laterality Date  . TONSILLECTOMY AND ADENOIDECTOMY    . WISDOM TOOTH EXTRACTION       OB History    Gravida  0   Para  0   Term  0   Preterm  0   AB  0   Living  0     SAB  0   TAB  0   Ectopic  0   Multiple  0   Live Births  0           Family History  Problem Relation Age of Onset  . Cancer Mother        Breast, Thyroid  . Asthma Brother   . Cancer Maternal Grandmother   . Diabetes Maternal Grandfather   . Heart disease Maternal Grandfather   . Diabetes Paternal Grandmother   . Cirrhosis Paternal Grandfather     Social History   Tobacco Use  . Smoking status: Former  Smoker    Packs/day: 0.10    Start date: 08/13/2013    Quit date: 07/12/2014    Years since quitting: 4.9  . Smokeless tobacco: Never Used  Substance Use Topics  . Alcohol use: No    Alcohol/week: 0.0 standard drinks  . Drug use: No    Home Medications Prior to Admission medications   Medication Sig Start Date End Date Taking? Authorizing Provider  ALPRAZolam (XANAX) 0.5 MG tablet TAKE 1 TABLET(0.5 MG) BY MOUTH TWICE DAILY AS NEEDED FOR ANXIETY 05/28/19   Waldon MerlMartin, William C, PA-C  FEROSUL 325 (65 Fe) MG tablet TAKE 1 TABLET(325 MG) BY MOUTH DAILY WITH BREAKFAST 07/03/19   Waldon MerlMartin, William C, PA-C  FLUoxetine (PROZAC) 40 MG capsule Take 1 capsule (40 mg total) by mouth daily. 03/30/19   Waldon MerlMartin, William C, PA-C  Norgestimate-Ethinyl Estradiol Triphasic 0.18/0.215/0.25 MG-25 MCG tab Take 1 tablet by mouth daily. 11/27/18   Waldon MerlMartin, William C, PA-C  pantoprazole (PROTONIX) 40 MG tablet Take 1 tablet (40 mg total) by mouth daily. 02/22/19   Waldon MerlMartin, William C, PA-C    Allergies    Patient has no known allergies.  Review of Systems   Review of Systems  Constitutional: Positive for chills and fever.  HENT: Positive  for congestion. Negative for ear pain and sore throat.   Respiratory: Positive for cough. Negative for shortness of breath.   Cardiovascular: Negative for chest pain.  Gastrointestinal: Positive for nausea and vomiting. Negative for abdominal pain, anal bleeding, blood in stool and diarrhea.  Genitourinary: Negative for dysuria, vaginal bleeding and vaginal discharge.  Neurological: Negative for syncope.  All other systems reviewed and are negative.   Physical Exam Updated Vital Signs BP (!) 152/84 (BP Location: Right Arm)   Pulse 87   Temp 98.2 F (36.8 C) (Oral)   Resp 16   Ht 5\' 3"  (1.6 m)   Wt 108.9 kg   LMP 06/26/2019   SpO2 99%   BMI 42.51 kg/m   Physical Exam Vitals and nursing note reviewed.  Constitutional:      General: She is not in acute distress.     Appearance: She is well-developed.  HENT:     Head: Normocephalic and atraumatic.     Right Ear: Ear canal normal. Tympanic membrane is not perforated, erythematous, retracted or bulging.     Left Ear: Ear canal normal. Tympanic membrane is not perforated, erythematous, retracted or bulging.     Ears:     Comments: No mastoid erythema/swelling/tenderness.     Nose:     Right Sinus: No maxillary sinus tenderness or frontal sinus tenderness.     Left Sinus: No maxillary sinus tenderness or frontal sinus tenderness.     Mouth/Throat:     Pharynx: Uvula midline. No oropharyngeal exudate or posterior oropharyngeal erythema.     Comments: Posterior oropharynx is symmetric appearing. Patient tolerating own secretions without difficulty. No trismus. No drooling. No hot potato voice. No swelling beneath the tongue, submandibular compartment is soft.  Eyes:     General:        Right eye: No discharge.        Left eye: No discharge.     Conjunctiva/sclera: Conjunctivae normal.     Pupils: Pupils are equal, round, and reactive to light.  Cardiovascular:     Rate and Rhythm: Normal rate and regular rhythm.     Heart sounds: No murmur.  Pulmonary:     Effort: Pulmonary effort is normal. No respiratory distress.     Breath sounds: Normal breath sounds. No wheezing, rhonchi or rales.  Abdominal:     General: There is no distension.     Palpations: Abdomen is soft.     Tenderness: There is no abdominal tenderness. There is no right CVA tenderness, left CVA tenderness, guarding or rebound.  Musculoskeletal:     Cervical back: Normal range of motion and neck supple. No edema or rigidity.  Lymphadenopathy:     Cervical: No cervical adenopathy.  Skin:    General: Skin is warm and dry.     Findings: No rash.  Neurological:     Mental Status: She is alert.  Psychiatric:        Behavior: Behavior normal.    ED Results / Procedures / Treatments   Labs (all labs ordered are listed, but only  abnormal results are displayed) Labs Reviewed - No data to display  EKG None  Radiology No results found.  Procedures Procedures (including critical care time)  Medications Ordered in ED Medications - No data to display  ED Course  I have reviewed the triage vital signs and the nursing notes.  Pertinent labs & imaging results that were available during my care of the patient were reviewed by me and  considered in my medical decision making (see chart for details).    MDM Rules/Calculators/A&P                      Patient presents to the emergency department with complaints of fever, loss of taste/smell, poor appetite, body aches, nausea, vomiting, and mild cough.  She is nontoxic-appearing, resting comfortably, initial elevated BP normalized, otherwise vitals WNL.  Her friend that she has spent most of her time with that has similar symptoms just tested positive for COVID-19.  Afebrile in the ER, no sinus tenderness, sxs < 10 days- doubt acute bacterial sinusitis. No signs of AOM/AOE/mastoiditis on exam. Oropharynx clear. No meningismus. Lungs CTA, afebrile in the ED, no dyspnea, states cough is minimal, doubt bacterial pneumonia. Labs with anemia similar to prior, mild hypocalcemia, mildly elevated LFTs- PCP recheck.  Patient feeling improved and able to tolerate p.o. in the ER.  Repeat abdominal exam w/o peritoneal signs. Overall suspect viral process, most likely COVID-19 with her recent sick contact with similar symptoms who tested positive.  We have repeated Covid swab in the ED per discussion with patient.  Will send home with supportive care. I discussed results, treatment plan, need for follow-up, and return precautions with the patient. Provided opportunity for questions, patient confirmed understanding and is in agreement with plan.   Sonya Santiago was evaluated in Emergency Department on 07/10/2019 for the symptoms described in the history of present illness. He/she was evaluated  in the context of the global COVID-19 pandemic, which necessitated consideration that the patient might be at risk for infection with the SARS-CoV-2 virus that causes COVID-19. Institutional protocols and algorithms that pertain to the evaluation of patients at risk for COVID-19 are in a state of rapid change based on information released by regulatory bodies including the CDC and federal and state organizations. These policies and algorithms were followed during the patient's care in the ED.   Blood pressure 119/75, pulse 68, temperature 98.2 F (36.8 C), temperature source Oral, resp. rate 18, height 5\' 3"  (1.6 m), weight 108.9 kg, last menstrual period 06/26/2019, SpO2 100 %.  Final Clinical Impression(s) / ED Diagnoses Final diagnoses:  Viral illness    Rx / DC Orders ED Discharge Orders         Ordered    ondansetron (ZOFRAN ODT) 4 MG disintegrating tablet  Every 8 hours PRN     07/10/19 2031    benzonatate (TESSALON) 100 MG capsule  Every 8 hours     07/10/19 2031           Amaryllis Dyke, PA-C 07/10/19 2035    Amaryllis Dyke, PA-C 07/10/19 2041    Maudie Flakes, MD 07/12/19 [phone]

## 2019-07-10 NOTE — ED Triage Notes (Signed)
Pt started feeling COVID symptoms last Wednesday. Lost sense of taste on Sunday. Is not able to eat or drink without it coming up. Afebrile. Nonproductive cough

## 2019-07-10 NOTE — Discharge Instructions (Addendum)
You were seen in the emergency department today for poor appetite, loss of taste/smell, fever, body aches, vomiting, and cough.  Your work-up was overall reassuring.  Your liver function tests are mildly elevated, please have these rechecked by primary care provider, in the meantime please avoid Tylenol and drinking alcohol..  Your urine was normal.  Your pregnancy test was negative.  We have retested you for COVID-19, we will call you with these results within 48 hours.  Please be sure to quarantine.  We are sending you home with Zofran to take every 8 hours as needed for nausea and vomiting and Tessalon to take every 8 hours as needed for coughing.  We have prescribed you new medication(s) today. Discuss the medications prescribed today with your pharmacist as they can have adverse effects and interactions with your other medicines including over the counter and prescribed medications. Seek medical evaluation if you start to experience new or abnormal symptoms after taking one of these medicines, seek care immediately if you start to experience difficulty breathing, feeling of your throat closing, facial swelling, or rash as these could be indications of a more serious allergic reaction  We are checking patients with COVID-19 or symptoms similar to this to quarantine yhemselves for 14 days. You may be able to discontinue self quarantine if the following conditions are met:   Persons with COVID-19 who have symptoms and were directed to care for themselves at home may discontinue home isolation under the  following conditions: - It has been at least 7 days have passed since symptoms first appeared. - AND at least 3 days (72 hours) have passed since recovery defined as resolution of fever without the use of fever-reducing medications and improvement in respiratory symptoms (e.g., cough, shortness of breath)  Please follow the below quarantine instructions.   Please follow up with primary care within 3-5  days for re-evaluation- call prior to going to the office to make them aware of your symptoms. Return to the ER for new or worsening symptoms including but not limited to increased work of breathing, inability to keep fluids down, chest pain, passing out, or any other concerns.       Person Under Monitoring Name: Sonya Santiago  Location: [address] [place] [address] [place] [address]   Infection Prevention Recommendations for Individuals Confirmed to have, or Being Evaluated for, 2019 Novel Coronavirus (COVID-19) Infection Who Receive Care at Home  Individuals who are confirmed to have, or are being evaluated for, COVID-19 should follow the prevention steps below until a healthcare provider or local or state health department says they can return to normal activities.  Stay home except to get medical care You should restrict activities outside your home, except for getting medical care. Do not go to work, school, or public areas, and do not use public transportation or taxis.  Call ahead before visiting your doctor Before your medical appointment, call the healthcare provider and tell them that you have, or are being evaluated for, COVID-19 infection. This will help the healthcare provider's office take steps to keep other people from getting infected. Ask your healthcare provider to call the local or state health department.  Monitor your symptoms Seek prompt medical attention if your illness is worsening (e.g., difficulty breathing). Before going to your medical appointment, call the healthcare provider and tell them that you have, or are being evaluated for, COVID-19 infection. Ask your healthcare provider to call the local or state health department.  Wear a facemask You should wear  a facemask that covers your nose and mouth when you are in the same room with other people and when you visit a healthcare provider. People who live with or visit you should also wear a facemask while they are in  the same room with you.  Separate yourself from other people in your home As much as possible, you should stay in a different room from other people in your home. Also, you should use a separate bathroom, if available.  Avoid sharing household items You should not share dishes, drinking glasses, cups, eating utensils, towels, bedding, or other items with other people in your home. After using these items, you should wash them thoroughly with soap and water.  Cover your coughs and sneezes Cover your mouth and nose with a tissue when you cough or sneeze, or you can cough or sneeze into your sleeve. Throw used tissues in a lined trash can, and immediately wash your hands with soap and water for at least 20 seconds or use an alcohol-based hand rub.  Wash your [facility] your hands often and thoroughly with soap and water for at least 20 seconds. You can use an alcohol-based hand sanitizer if soap and water are not available and if your hands are not visibly dirty. Avoid touching your eyes, nose, and mouth with unwashed hands.   Prevention Steps for Caregivers and Household Members of Individuals Confirmed to have, or Being Evaluated for, COVID-19 Infection Being Cared for in the Home  If you live with, or provide care at home for, a person confirmed to have, or being evaluated for, COVID-19 infection please follow these guidelines to prevent infection:  Follow healthcare provider's instructions Make sure that you understand and can help the patient follow any healthcare provider instructions for all care.  Provide for the patient's basic needs You should help the patient with basic needs in the home and provide support for getting groceries, prescriptions, and other personal needs.  Monitor the patient's symptoms If they are getting sicker, call his or her medical provider and tell them that the patient has, or is being evaluated for, COVID-19 infection. This will help the healthcare  provider's office take steps to keep other people from getting infected. Ask the healthcare provider to call the local or state health department.  Limit the number of people who have contact with the patient If possible, have only one caregiver for the patient. Other household members should stay in another home or place of residence. If this is not possible, they should stay in another room, or be separated from the patient as much as possible. Use a separate bathroom, if available. Restrict visitors who do not have an essential need to be in the home.  Keep older adults, very young children, and other sick people away from the patient Keep older adults, very young children, and those who have compromised immune systems or chronic health conditions away from the patient. This includes people with chronic heart, lung, or kidney conditions, diabetes, and cancer.  Ensure good ventilation Make sure that shared spaces in the home have good air flow, such as from an air conditioner or an opened window, weather permitting.  Wash your hands often Wash your hands often and thoroughly with soap and water for at least 20 seconds. You can use an alcohol based hand sanitizer if soap and water are not available and if your hands are not visibly dirty. Avoid touching your eyes, nose, and mouth with unwashed hands. Use disposable  paper towels to dry your hands. If not available, use dedicated cloth towels and replace them when they become wet.  Wear a facemask and gloves Wear a disposable facemask at all times in the room and gloves when you touch or have contact with the patient's blood, body fluids, and/or secretions or excretions, such as sweat, saliva, sputum, nasal mucus, vomit, urine, or feces.  Ensure the mask fits over your nose and mouth tightly, and do not touch it during use. Throw out disposable facemasks and gloves after using them. Do not reuse. Wash your hands immediately after removing your  facemask and gloves. If your personal clothing becomes contaminated, carefully remove clothing and launder. Wash your hands after handling contaminated clothing. Place all used disposable facemasks, gloves, and other waste in a lined container before disposing them with other household waste. Remove gloves and wash your hands immediately after handling these items.  Do not share dishes, glasses, or other household items with the patient Avoid sharing household items. You should not share dishes, drinking glasses, cups, eating utensils, towels, bedding, or other items with a patient who is confirmed to have, or being evaluated for, COVID-19 infection. After the person uses these items, you should wash them thoroughly with soap and water.  Wash laundry thoroughly Immediately remove and wash clothes or bedding that have blood, body fluids, and/or secretions or excretions, such as sweat, saliva, sputum, nasal mucus, vomit, urine, or feces, on them. Wear gloves when handling laundry from the patient. Read and follow directions on labels of laundry or clothing items and detergent. In general, wash and dry with the warmest temperatures recommended on the label.  Clean all areas the individual has used often Clean all touchable surfaces, such as counters, tabletops, doorknobs, bathroom fixtures, toilets, phones, keyboards, tablets, and bedside tables, every day. Also, clean any surfaces that may have blood, body fluids, and/or secretions or excretions on them. Wear gloves when cleaning surfaces the patient has come in contact with. Use a diluted bleach solution (e.g., dilute bleach with 1 part bleach and 10 parts water) or a household disinfectant with a label that says EPA-registered for coronaviruses. To make a bleach solution at home, add 1 tablespoon of bleach to 1 quart (4 cups) of water. For a larger supply, add  cup of bleach to 1 gallon (16 cups) of water. Read labels of cleaning products and  follow recommendations provided on product labels. Labels contain instructions for safe and effective use of the cleaning product including precautions you should take when applying the product, such as wearing gloves or eye protection and making sure you have good ventilation during use of the product. Remove gloves and wash hands immediately after cleaning.  Monitor yourself for signs and symptoms of illness Caregivers and household members are considered close contacts, should monitor their health, and will be asked to limit movement outside of the home to the extent possible. Follow the monitoring steps for close contacts listed on the symptom monitoring form.   ? If you have additional questions, contact your local health department or call the epidemiologist on call at [phone] (available 24/7). ? This guidance is subject to change. For the most up-to-date guidance from [facility], please refer to their website: [email or web address]

## 2019-07-12 LAB — NOVEL CORONAVIRUS, NAA (HOSP ORDER, SEND-OUT TO REF LAB; TAT 18-24 HRS): SARS-CoV-2, NAA: NOT DETECTED

## 2019-07-30 ENCOUNTER — Other Ambulatory Visit: Payer: Self-pay | Admitting: Emergency Medicine

## 2019-07-30 DIAGNOSIS — F329 Major depressive disorder, single episode, unspecified: Secondary | ICD-10-CM

## 2019-07-30 DIAGNOSIS — F419 Anxiety disorder, unspecified: Secondary | ICD-10-CM

## 2019-07-30 MED ORDER — FLUOXETINE HCL 40 MG PO CAPS: 40.0000 mg | ORAL_CAPSULE | Freq: Every day | ORAL | 1 refills | Status: DC

## 2019-09-07 ENCOUNTER — Encounter: Payer: Self-pay | Admitting: Physician Assistant

## 2019-09-07 ENCOUNTER — Other Ambulatory Visit: Payer: Self-pay | Admitting: Physician Assistant

## 2019-09-07 DIAGNOSIS — K219 Gastro-esophageal reflux disease without esophagitis: Secondary | ICD-10-CM

## 2019-09-07 MED ORDER — PANTOPRAZOLE SODIUM 40 MG PO TBEC: 40.0000 mg | DELAYED_RELEASE_TABLET | Freq: Every day | ORAL | 1 refills | Status: DC

## 2019-09-07 MED ORDER — ALPRAZOLAM 0.5 MG PO TABS: ORAL_TABLET | ORAL | 1 refills | Status: DC

## 2019-09-17 ENCOUNTER — Encounter: Payer: Self-pay | Admitting: Physician Assistant

## 2019-09-17 ENCOUNTER — Other Ambulatory Visit: Payer: Self-pay

## 2019-09-17 ENCOUNTER — Ambulatory Visit (INDEPENDENT_AMBULATORY_CARE_PROVIDER_SITE_OTHER): Payer: 59 | Admitting: Physician Assistant

## 2019-09-17 VITALS — Ht 63.0 in | Wt 245.8 lb

## 2019-09-17 DIAGNOSIS — F5081 Binge eating disorder: Secondary | ICD-10-CM

## 2019-09-17 DIAGNOSIS — N649 Disorder of breast, unspecified: Secondary | ICD-10-CM | POA: Diagnosis not present

## 2019-09-17 DIAGNOSIS — Z111 Encounter for screening for respiratory tuberculosis: Secondary | ICD-10-CM

## 2019-09-17 DIAGNOSIS — L988 Other specified disorders of the skin and subcutaneous tissue: Secondary | ICD-10-CM

## 2019-09-17 DIAGNOSIS — R7303 Prediabetes: Secondary | ICD-10-CM

## 2019-09-17 MED ORDER — LISDEXAMFETAMINE DIMESYLATE 30 MG PO CAPS: 30.0000 mg | ORAL_CAPSULE | Freq: Every day | ORAL | 0 refills | Status: DC

## 2019-09-17 NOTE — Progress Notes (Signed)
Virtual Visit via Video   I connected with patient on 09/17/19 at  3:30 PM EST by a video enabled telemedicine application and verified that I am speaking with the correct person using two identifiers.  Location patient: Home Location provider: [facility], Office Persons participating in the virtual visit: Patient, Provider, CMA (Patina Moore)  I discussed the limitations of evaluation and management by telemedicine and the availability of in person appointments. The patient expressed understanding and agreed to proceed.  Subjective:   HPI:   Patient presents via doxy.need today to discuss 2 separate issues.  Patient endorses noting a reddened area of the skin of her right breast, just inferior to areola over the past couple of weeks.  Notes the area is slightly flaky/scaly.  Denies any trauma or injury to the area.  Denies pain or pruritus.  Denies other breast changes.  Has not applied anything to the area.  Denies similar lesion elsewhere.  Patient also endorses alternating episodes of fasting and excessive food intake, for starting in her teenage years without worsening over the past year.  Notes a lot of time she does not feel hungry and will go without eating.  Will then occasionally have episodes of eating excessive amounts of food.  For example she notes the other day she went to [employer] and got to large size condos, eating both of those for dinner.  Will eat until she feels miserable.  Denies any purging, laxative use, use of diuretics or excessive exercising.  Has history of anxiety and depression for which she takes fluoxetine with good relief of symptoms.  ROS:   See pertinent positives and negatives per HPI.  Patient Active Problem List   Diagnosis Date Noted  . Pre-diabetes 11/15/2018  . Gastroesophageal reflux disease without esophagitis 11/15/2018  . Morbid obesity (HCC) 11/15/2018  . Infertility, anovulation 08/30/2016  . Anovulation 08/09/2016      Social History   Tobacco Use  . Smoking status: Former Smoker    Packs/day: 0.10    Start date: 08/13/2013    Quit date: 07/12/2014    Years since quitting: 5.1  . Smokeless tobacco: Never Used  Substance Use Topics  . Alcohol use: No    Alcohol/week: 0.0 standard drinks    Current Outpatient Medications:  .  ALPRAZolam (XANAX) 0.5 MG tablet, TAKE 1 TABLET(0.5 MG) BY MOUTH TWICE DAILY AS NEEDED FOR ANXIETY, Disp: 10 tablet, Rfl: 1 .  FLUoxetine (PROZAC) 40 MG capsule, Take 1 capsule (40 mg total) by mouth daily., Disp: 90 capsule, Rfl: 1 .  pantoprazole (PROTONIX) 40 MG tablet, Take 1 tablet (40 mg total) by mouth daily., Disp: 90 tablet, Rfl: 1  No Known Allergies  Objective:   Ht 5\' 3"  (1.6 m)   Wt 245 lb 12.8 oz (111.5 kg)   BMI 43.54 kg/m   Patient is well-developed, well-nourished in no acute distress.  Resting comfortably at home.  Head is normocephalic, atraumatic.  No labored breathing.  Speech is clear and coherent with logical content.  Patient is alert and oriented at baseline.   Assessment and Plan:   1. Binge eating disorder Discussed CBT. Contact information given for this. Will start trial of Vyvanse 30 mg QD for binge eating. Close follow-up scheduled.   2. Lesion of skin of breast Unable to visualize via doxy today.  Suspect given young age this is likely eczematous.  Does have nipple piercings bilaterally.  Have been present for several months.  Does not use metal  piercing.  Leeanne Rio, PA-C 09/17/2019

## 2019-09-17 NOTE — Patient Instructions (Signed)
Instructions sent to MyChart.   Please keep skin clean and dry. Use a unscented, non-dyed moisturizing soap to the skin (Dove, Cetaphil). Make sure you dry skin completely after showering. Can apply small amount of hydrocortisone to the area twice daily over the next few days until you are able to come in to the office for detailed examination. Report any new or worsening symptoms to [place] ASAP.  Consider calling our counseling services at [phone] to set up appointment with a therapist for the binge eating disorder.  You can also call [facility] at [phone]. Start the Vyvanse once daily. Follow-up with me in 3-4 weeks to let me know how things are going and so we can make further adjustments.

## 2019-09-17 NOTE — Progress Notes (Signed)
I have discussed the procedure for the virtual visit with the patient who has given consent to proceed with assessment and treatment.   Judd Mccubbin S Camay Pedigo, CMA     

## 2019-09-18 ENCOUNTER — Other Ambulatory Visit: Payer: Self-pay | Admitting: Physician Assistant

## 2019-09-18 DIAGNOSIS — Z111 Encounter for screening for respiratory tuberculosis: Secondary | ICD-10-CM | POA: Diagnosis not present

## 2019-09-18 DIAGNOSIS — R7303 Prediabetes: Secondary | ICD-10-CM | POA: Diagnosis not present

## 2019-09-18 MED ORDER — LISDEXAMFETAMINE DIMESYLATE 30 MG PO CAPS: 30.0000 mg | ORAL_CAPSULE | Freq: Every day | ORAL | 0 refills | Status: DC

## 2019-09-20 ENCOUNTER — Encounter: Payer: Self-pay | Admitting: Physician Assistant

## 2019-09-21 LAB — QUANTIFERON-TB GOLD PLUS
QuantiFERON Mitogen Value: >10 IU/mL
QuantiFERON Nil Value: 0.04 IU/mL
QuantiFERON TB1 Ag Value: 0.04 IU/mL
QuantiFERON TB2 Ag Value: 0.05 IU/mL
QuantiFERON-TB Gold Plus: NEGATIVE

## 2019-09-21 LAB — HGB A1C W/O EAG: Hgb A1c MFr Bld: 5.9 % — ABNORMAL HIGH (ref 4.8–5.6)

## 2019-09-26 ENCOUNTER — Encounter: Payer: Self-pay | Admitting: Emergency Medicine

## 2019-10-18 ENCOUNTER — Encounter: Payer: Self-pay | Admitting: Physician Assistant

## 2019-10-18 MED ORDER — LISDEXAMFETAMINE DIMESYLATE 30 MG PO CAPS: 30.0000 mg | ORAL_CAPSULE | Freq: Every day | ORAL | 0 refills | Status: DC

## 2019-10-18 NOTE — Telephone Encounter (Signed)
Patient is due for a 3-4 week follow. She is requesting a refill of the Vyvanse last rx 09-18-19 at her last visit on 09-17-19. Please advise

## 2019-10-18 NOTE — Telephone Encounter (Signed)
16-month refill sent in for her. Will need follow-up before next fill.

## 2019-12-19 ENCOUNTER — Encounter: Payer: Self-pay | Admitting: Physician Assistant

## 2019-12-19 ENCOUNTER — Other Ambulatory Visit: Payer: Self-pay | Admitting: Emergency Medicine

## 2019-12-19 DIAGNOSIS — K219 Gastro-esophageal reflux disease without esophagitis: Secondary | ICD-10-CM

## 2019-12-19 DIAGNOSIS — F419 Anxiety disorder, unspecified: Secondary | ICD-10-CM

## 2019-12-19 DIAGNOSIS — F5081 Binge eating disorder: Secondary | ICD-10-CM

## 2019-12-19 MED ORDER — FLUOXETINE HCL 40 MG PO CAPS: 40.0000 mg | ORAL_CAPSULE | Freq: Every day | ORAL | 1 refills | Status: DC

## 2019-12-19 MED ORDER — PANTOPRAZOLE SODIUM 40 MG PO TBEC: 40.0000 mg | DELAYED_RELEASE_TABLET | Freq: Every day | ORAL | 1 refills | Status: DC

## 2019-12-19 MED ORDER — LISDEXAMFETAMINE DIMESYLATE 30 MG PO CAPS: 30.0000 mg | ORAL_CAPSULE | Freq: Every day | ORAL | 0 refills | Status: DC

## 2019-12-19 NOTE — Telephone Encounter (Signed)
Patient has scheduled a my chart visit for 12/25/19 with Malva Cogan.   Patient is requesting Selena Batten to send a one week script of this med to [facility] on [address] in [place].

## 2019-12-25 ENCOUNTER — Other Ambulatory Visit: Payer: Self-pay

## 2019-12-25 ENCOUNTER — Telehealth (INDEPENDENT_AMBULATORY_CARE_PROVIDER_SITE_OTHER): Payer: 59 | Admitting: Physician Assistant

## 2019-12-25 ENCOUNTER — Encounter: Payer: Self-pay | Admitting: Physician Assistant

## 2019-12-25 DIAGNOSIS — F5081 Binge eating disorder: Secondary | ICD-10-CM | POA: Insufficient documentation

## 2019-12-25 DIAGNOSIS — F50819 Binge eating disorder, unspecified: Secondary | ICD-10-CM | POA: Insufficient documentation

## 2019-12-25 NOTE — Progress Notes (Signed)
   Virtual Visit via Video   I connected with patient on 12/25/19 at  1:00 PM EDT by a video enabled telemedicine application and verified that I am speaking with the correct person using two identifiers.  Location patient: Home Location provider: [place], Office Persons participating in the virtual visit: Patient, Provider, CMA (Patina Moore)  I discussed the limitations of evaluation and management by telemedicine and the availability of in person appointments. The patient expressed understanding and agreed to proceed.  Subjective:   HPI:   Patient presents via [facility] today for follow-up regarding binge eating disorder. Patient was placed on Vyvanse at last visit. Is currently on a regimen of 30 mg daily. Has been taking daily as directed and tolerating well without noted side effects. Notes significant improvement in binges. Is keeping a normal appetite. Notes mood is staying stable. States she has also noted she is able to focus and get things done at work and with school. States she never realized just how inattentive she was until starting this medicine for binge eating. States she is much more efficient with work and grades have improved. Is sleeping well at night. Is down 12 pounds.   ROS:   See pertinent positives and negatives per HPI.  Patient Active Problem List   Diagnosis Date Noted  . Pre-diabetes 11/15/2018  . Gastroesophageal reflux disease without esophagitis 11/15/2018  . Morbid obesity (HCC) 11/15/2018  . Infertility, anovulation 08/30/2016  . Anovulation 08/09/2016    Social History   Tobacco Use  . Smoking status: Former Smoker    Packs/day: 0.10    Start date: 08/13/2013    Quit date: 07/12/2014    Years since quitting: 5.4  . Smokeless tobacco: Never Used  Substance Use Topics  . Alcohol use: No    Alcohol/week: 0.0 standard drinks    Current Outpatient Medications:  .  ALPRAZolam (XANAX) 0.5 MG tablet, TAKE 1 TABLET(0.5 MG) BY MOUTH  TWICE DAILY AS NEEDED FOR ANXIETY, Disp: 10 tablet, Rfl: 1 .  FLUoxetine (PROZAC) 40 MG capsule, Take 1 capsule (40 mg total) by mouth daily., Disp: 90 capsule, Rfl: 1 .  lisdexamfetamine (VYVANSE) 30 MG capsule, Take 1 capsule (30 mg total) by mouth daily., Disp: 7 capsule, Rfl: 0 .  pantoprazole (PROTONIX) 40 MG tablet, Take 1 tablet (40 mg total) by mouth daily., Disp: 90 tablet, Rfl: 1  No Known Allergies  Objective:   There were no vitals taken for this visit.  Patient is well-developed, well-nourished in no acute distress.  Resting comfortably at home.  Head is normocephalic, atraumatic.  No labored breathing.  Speech is clear and coherent with logical content.  Patient is alert and oriented at baseline.   Assessment and Plan:   1. Binge eating disorder Doing well. Continue current regimen. Follow-up 3 months.     Piedad Climes, PA-C 12/25/2019

## 2019-12-28 ENCOUNTER — Other Ambulatory Visit: Payer: Self-pay | Admitting: Physician Assistant

## 2019-12-28 DIAGNOSIS — F50819 Binge eating disorder, unspecified: Secondary | ICD-10-CM

## 2019-12-28 DIAGNOSIS — F5081 Binge eating disorder: Secondary | ICD-10-CM

## 2019-12-28 MED ORDER — LISDEXAMFETAMINE DIMESYLATE 30 MG PO CAPS: 30.0000 mg | ORAL_CAPSULE | Freq: Every day | ORAL | 0 refills | Status: DC

## 2019-12-28 NOTE — Telephone Encounter (Signed)
Last OV 12/25/19 Vyvanse last filled 12/19/19 #7 with 0

## 2020-01-14 ENCOUNTER — Encounter: Payer: Self-pay | Admitting: Physician Assistant

## 2020-01-14 NOTE — Telephone Encounter (Signed)
Last OV 12/25/19 Alprazolam last filled 09/07/19 #10 with 1

## 2020-01-15 MED ORDER — ALPRAZOLAM 0.5 MG PO TABS: ORAL_TABLET | ORAL | 1 refills | Status: DC

## 2020-01-21 ENCOUNTER — Emergency Department (HOSPITAL_COMMUNITY)
Admission: EM | Admit: 2020-01-21 | Discharge: 2020-01-24 | Disposition: A | Discharge status: Other Acute Inpatient Hospital | Payer: 59 | Attending: Emergency Medicine | Admitting: Emergency Medicine

## 2020-01-21 ENCOUNTER — Encounter (HOSPITAL_COMMUNITY): Payer: Self-pay | Admitting: Emergency Medicine

## 2020-01-21 ENCOUNTER — Other Ambulatory Visit: Payer: Self-pay

## 2020-01-21 DIAGNOSIS — T50902A Poisoning by unspecified drugs, medicaments and biological substances, intentional self-harm, initial encounter: Secondary | ICD-10-CM

## 2020-01-21 DIAGNOSIS — Z20822 Contact with and (suspected) exposure to covid-19: Secondary | ICD-10-CM | POA: Insufficient documentation

## 2020-01-21 DIAGNOSIS — R45851 Suicidal ideations: Secondary | ICD-10-CM | POA: Insufficient documentation

## 2020-01-21 DIAGNOSIS — T424X1A Poisoning by benzodiazepines, accidental (unintentional), initial encounter: Secondary | ICD-10-CM | POA: Insufficient documentation

## 2020-01-21 DIAGNOSIS — E162 Hypoglycemia, unspecified: Secondary | ICD-10-CM

## 2020-01-21 DIAGNOSIS — T43622A Poisoning by amphetamines, intentional self-harm, initial encounter: Secondary | ICD-10-CM | POA: Insufficient documentation

## 2020-01-21 DIAGNOSIS — Z87891 Personal history of nicotine dependence: Secondary | ICD-10-CM | POA: Diagnosis not present

## 2020-01-21 DIAGNOSIS — F29 Unspecified psychosis not due to a substance or known physiological condition: Secondary | ICD-10-CM | POA: Diagnosis not present

## 2020-01-21 DIAGNOSIS — T887XXA Unspecified adverse effect of drug or medicament, initial encounter: Secondary | ICD-10-CM | POA: Diagnosis not present

## 2020-01-21 DIAGNOSIS — R0689 Other abnormalities of breathing: Secondary | ICD-10-CM | POA: Diagnosis not present

## 2020-01-21 DIAGNOSIS — T50992A Poisoning by other drugs, medicaments and biological substances, intentional self-harm, initial encounter: Secondary | ICD-10-CM | POA: Diagnosis not present

## 2020-01-21 DIAGNOSIS — T407X2A Poisoning by cannabis (derivatives), intentional self-harm, initial encounter: Secondary | ICD-10-CM | POA: Diagnosis not present

## 2020-01-21 DIAGNOSIS — Z03818 Encounter for observation for suspected exposure to other biological agents ruled out: Secondary | ICD-10-CM | POA: Diagnosis not present

## 2020-01-21 DIAGNOSIS — I10 Essential (primary) hypertension: Secondary | ICD-10-CM | POA: Diagnosis not present

## 2020-01-21 DIAGNOSIS — F419 Anxiety disorder, unspecified: Secondary | ICD-10-CM | POA: Diagnosis present

## 2020-01-21 DIAGNOSIS — R000 Tachycardia, unspecified: Secondary | ICD-10-CM | POA: Diagnosis not present

## 2020-01-21 LAB — COMPREHENSIVE METABOLIC PANEL WITH GFR
ALT: 29 U/L (ref 0–44)
AST: 20 U/L (ref 15–41)
Albumin: 4.4 g/dL (ref 3.5–5.0)
Alkaline Phosphatase: 83 U/L (ref 38–126)
Anion gap: 12 (ref 5–15)
BUN: 11 mg/dL (ref 6–20)
CO2: 24 mmol/L (ref 22–32)
Calcium: 9.4 mg/dL (ref 8.9–10.3)
Chloride: 103 mmol/L (ref 98–111)
Creatinine, Ser: 0.66 mg/dL (ref 0.44–1.00)
GFR calc Af Amer: >60 mL/min
GFR calc non Af Amer: >60 mL/min
Glucose, Bld: 100 mg/dL — ABNORMAL HIGH (ref 70–99)
Potassium: 3.4 mmol/L — ABNORMAL LOW (ref 3.5–5.1)
Sodium: 139 mmol/L (ref 135–145)
Total Bilirubin: 0.6 mg/dL (ref 0.3–1.2)
Total Protein: 8.2 g/dL — ABNORMAL HIGH (ref 6.5–8.1)

## 2020-01-21 LAB — CBC
HCT: 37.8 % (ref 36.0–46.0)
Hemoglobin: 11.4 g/dL — ABNORMAL LOW (ref 12.0–15.0)
MCH: 23.7 pg — ABNORMAL LOW (ref 26.0–34.0)
MCHC: 30.2 g/dL (ref 30.0–36.0)
MCV: 78.4 fL — ABNORMAL LOW (ref 80.0–100.0)
Platelets: 507 10*3/uL — ABNORMAL HIGH (ref 150–400)
RBC: 4.82 MIL/uL (ref 3.87–5.11)
RDW: 15 % (ref 11.5–15.5)
WBC: 13.1 10*3/uL — ABNORMAL HIGH (ref 4.0–10.5)
nRBC: 0 % (ref 0.0–0.2)

## 2020-01-21 LAB — PREGNANCY, URINE: Preg Test, Ur: NEGATIVE

## 2020-01-21 LAB — CBG MONITORING, ED
Glucose-Capillary: 53 mg/dL — ABNORMAL LOW (ref 70–99)
Glucose-Capillary: 155 mg/dL — ABNORMAL HIGH (ref 70–99)

## 2020-01-21 LAB — ACETAMINOPHEN LEVEL: Acetaminophen (Tylenol), Serum: <10 ug/mL — ABNORMAL LOW (ref 10–30)

## 2020-01-21 LAB — ETHANOL: Alcohol, Ethyl (B): <10 mg/dL (ref <10)

## 2020-01-21 LAB — RAPID URINE DRUG SCREEN, HOSP PERFORMED
Amphetamines: POSITIVE — AB
Barbiturates: NOT DETECTED
Benzodiazepines: POSITIVE — AB
Cocaine: NOT DETECTED
Opiates: NOT DETECTED
Tetrahydrocannabinol: POSITIVE — AB

## 2020-01-21 LAB — SALICYLATE LEVEL: Salicylate Lvl: <7 mg/dL — ABNORMAL LOW (ref 7.0–30.0)

## 2020-01-21 MED ORDER — DEXTROSE-NACL 5-0.9 % IV SOLN: INTRAVENOUS | Status: DC

## 2020-01-21 MED ORDER — DEXTROSE 50 % IV SOLN
INTRAVENOUS | Status: AC
  Administered 2020-01-21: 50 mL via INTRAVENOUS
  Filled 2020-01-21: qty 50

## 2020-01-21 MED ORDER — SODIUM CHLORIDE 0.9 % IV SOLN: INTRAVENOUS | Status: DC

## 2020-01-21 MED ORDER — LORAZEPAM 2 MG/ML IJ SOLN
1.0000 mg | Freq: Once | INTRAMUSCULAR | Status: AC
  Administered 2020-01-21: 1 mg via INTRAVENOUS
  Filled 2020-01-21: qty 1

## 2020-01-21 MED ORDER — SODIUM CHLORIDE 0.9 % IV BOLUS
1000.0000 mL | Freq: Once | INTRAVENOUS | Status: AC
  Administered 2020-01-21: 1000 mL via INTRAVENOUS

## 2020-01-21 MED ORDER — DEXTROSE 50 % IV SOLN: 50.0000 mL | Freq: Once | INTRAVENOUS | Status: AC

## 2020-01-21 NOTE — ED Triage Notes (Signed)
Pt here via EMS after suicide attempt tonight after taking a mixture of fluoxetine, vyvanse, lorazepam, oxycodone. Pt took the pills and mixed them all together in 1 bottle and took them. Pt admits to self cutting for the past 2 weeks. Pts grandmother was buried today, then pt was involved in an argument with mother. Pt did admit to EMS that she planned on getting her dads gun and shooting herself.

## 2020-01-21 NOTE — ED Notes (Signed)
Pt requesting glasses

## 2020-01-21 NOTE — ED Provider Notes (Signed)
11:53 PM Assumed care from Dr. Deretha Emory, please see their note for full history, physical and decision making until this point. In brief this is a 24 y.o. year old female who presented to the ED tonight with Drug Overdose and Suicidal     Multifactorial overdose with some hypoglycemia as well. Not IVC'ed right now. Pending 8 hour reeval at 0400 with repeat ecg/cbg/tylenol and needs tts and IVC PRN.  reeval with some dry heaves, didn't improve with zofran. Will try phenergan. Does appear to be medically cleared for tts consultation at this time.   Labs, studies and imaging reviewed by myself and considered in medical decision making if ordered. Imaging interpreted by radiology.  Labs Reviewed  COMPREHENSIVE METABOLIC PANEL - Abnormal; Notable for the following components:      Result Value   Potassium 3.4 (*)    Glucose, Bld 100 (*)    Total Protein 8.2 (*)    All other components within normal limits  SALICYLATE LEVEL - Abnormal; Notable for the following components:   Salicylate Lvl <7.0 (*)    All other components within normal limits  ACETAMINOPHEN LEVEL - Abnormal; Notable for the following components:   Acetaminophen (Tylenol), Serum <10 (*)    All other components within normal limits  CBC - Abnormal; Notable for the following components:   WBC 13.1 (*)    Hemoglobin 11.4 (*)    MCV 78.4 (*)    MCH 23.7 (*)    Platelets 507 (*)    All other components within normal limits  RAPID URINE DRUG SCREEN, HOSP PERFORMED - Abnormal; Notable for the following components:   Benzodiazepines POSITIVE (*)    Amphetamines POSITIVE (*)    Tetrahydrocannabinol POSITIVE (*)    All other components within normal limits  CBG MONITORING, ED - Abnormal; Notable for the following components:   Glucose-Capillary 53 (*)    All other components within normal limits  CBG MONITORING, ED - Abnormal; Notable for the following components:   Glucose-Capillary 155 (*)    All other components within  normal limits  ETHANOL  PREGNANCY, URINE  ACETAMINOPHEN LEVEL  ACETAMINOPHEN LEVEL  POC URINE PREG, ED    No orders to display    No follow-ups on file.    Jaielle Dlouhy, Barbara Cower, MD 01/22/20 [phone]

## 2020-01-21 NOTE — ED Provider Notes (Signed)
[facility] EMERGENCY DEPARTMENT Provider Note   CSN: [id number] Arrival date & time: 01/21/20  2108     History Chief Complaint  Patient presents with  . Drug Overdose  . Suicidal    Sonya Katrinka BlazingSmith is a 24 y.o. female.  Patient brought in by EMS.  Intentional overdose.  Patient states that around 8 PM in a suicide attempt she took a mixture of Prozac Vyvanse lorazepam oxycodone.  Patient states she mixed them altogether in 1 bottle and took them.  She also admits to self cutting for the past 2 weeks.  [name] was buried today patient was involved in an argument with her mother.  Patient did admit to EMS she planned on getting her dad's gun and shooting herself.  Poison control contacted.  They recommended cardiac monitoring minimal of 8 hours.  That would take us to about 4 in the morning.  Amended initial EKG which was done and then an EKG at the end of the observation.  Also recommended IV fluids benzos if needed for seizures.  We will going give patient some Ativan now even though she supposedly took lorazepam.  I also recommended repeat Tylenol level around 2330.  Patient medically cleared will require behavioral health consultation.   Patient apparently was found unresponsive.  EMS called.  Was found by her husband.        Past Medical History:  Diagnosis Date  . Allergy   . Anxiety     Patient Active Problem List   Diagnosis Date Noted  . Binge eating disorder 12/25/2019  . Pre-diabetes 11/15/2018  . Gastroesophageal reflux disease without esophagitis 11/15/2018  . Morbid obesity (HCC) 11/15/2018  . Infertility, anovulation 08/30/2016  . Anovulation 08/09/2016    Past Surgical History:  Procedure Laterality Date  . TONSILLECTOMY AND ADENOIDECTOMY    . WISDOM TOOTH EXTRACTION       OB History    Gravida  0   Para  0   Term  0   Preterm  0   AB  0   Living  0     SAB  0   TAB  0   Ectopic  0   Multiple  0   Live Births  0             Family History  Problem Relation Age of Onset  . Cancer Mother        Breast, Thyroid  . Asthma Brother   . Cancer Maternal Grandmother   . Diabetes Maternal Grandfather   . Heart disease Maternal Grandfather   . Diabetes Paternal Grandmother   . Cirrhosis Paternal Grandfather     Social History   Tobacco Use  . Smoking status: Former Smoker    Packs/day: 0.10    Start date: 08/13/2013    Quit date: 07/12/2014    Years since quitting: 5.5  . Smokeless tobacco: Never Used  Vaping Use  . Vaping Use: Never used  Substance Use Topics  . Alcohol use: No    Alcohol/week: 0.0 standard drinks  . Drug use: No    Home Medications Prior to Admission medications   Medication Sig Start Date End Date Taking? Authorizing Provider  ALPRAZolam (XANAX) 0.5 MG tablet TAKE 1 TABLET(0.5 MG) BY MOUTH TWICE DAILY AS NEEDED FOR ANXIETY Patient taking differently: Take 0.5 mg by mouth 2 (two) times daily as needed for anxiety or sleep.  01/15/20  Yes Waldon MerlMartin, William C, PA-C  FLUoxetine (PROZAC) 40 MG capsule Take  1 capsule (40 mg total) by mouth daily. 12/19/19  Yes Waldon Merl, PA-C  lisdexamfetamine (VYVANSE) 30 MG capsule Take 1 capsule (30 mg total) by mouth daily. 12/28/19  Yes Waldon Merl, PA-C  pantoprazole (PROTONIX) 40 MG tablet Take 1 tablet (40 mg total) by mouth daily. 12/19/19  Yes Waldon Merl, PA-C    Allergies    Patient has no known allergies.  Review of Systems   Review of Systems  Constitutional: Negative for chills and fever.  HENT: Negative for congestion, rhinorrhea and sore throat.   Eyes: Negative for visual disturbance.  Respiratory: Negative for cough and shortness of breath.   Cardiovascular: Negative for chest pain and leg swelling.  Gastrointestinal: Negative for abdominal pain, diarrhea, nausea and vomiting.  Genitourinary: Negative for dysuria.  Musculoskeletal: Negative for back pain and neck pain.  Skin: Negative for rash.   Neurological: Negative for dizziness, light-headedness and headaches.  Hematological: Does not bruise/bleed easily.  Psychiatric/Behavioral: Positive for suicidal ideas. Negative for confusion. The patient is nervous/anxious.     Physical Exam Updated Vital Signs BP (!) 139/97   Pulse 94   Resp 19   Ht 1.6 m (5\' 3" )   Wt 104.3 kg   SpO2 96%   BMI 40.74 kg/m   Physical Exam Vitals and nursing note reviewed.  Constitutional:      General: She is not in acute distress.    Appearance: Normal appearance. She is well-developed.  HENT:     Head: Normocephalic and atraumatic.  Eyes:     Extraocular Movements: Extraocular movements intact.     Conjunctiva/sclera: Conjunctivae normal.     Pupils: Pupils are equal, round, and reactive to light.  Cardiovascular:     Rate and Rhythm: Normal rate and regular rhythm.     Heart sounds: No murmur heard.   Pulmonary:     Effort: Pulmonary effort is normal. No respiratory distress.     Breath sounds: Normal breath sounds.  Abdominal:     Palpations: Abdomen is soft.     Tenderness: There is no abdominal tenderness.  Musculoskeletal:        General: Normal range of motion.     Cervical back: Normal range of motion and neck supple.  Skin:    General: Skin is warm and dry.     Capillary Refill: Capillary refill takes less than 2 seconds.  Neurological:     General: No focal deficit present.     Mental Status: She is alert and oriented to person, place, and time.     Cranial Nerves: No cranial nerve deficit.     Sensory: No sensory deficit.     Motor: No weakness.     Comments: Patient upset and tearful.  Does admit to it being a suicide attempt.     ED Results / Procedures / Treatments   Labs (all labs ordered are listed, but only abnormal results are displayed) Labs Reviewed  COMPREHENSIVE METABOLIC PANEL - Abnormal; Notable for the following components:      Result Value   Potassium 3.4 (*)    Glucose, Bld 100 (*)    Total  Protein 8.2 (*)    All other components within normal limits  SALICYLATE LEVEL - Abnormal; Notable for the following components:   Salicylate Lvl <7.0 (*)    All other components within normal limits  ACETAMINOPHEN LEVEL - Abnormal; Notable for the following components:   Acetaminophen (Tylenol), Serum <10 (*)    All  other components within normal limits  CBC - Abnormal; Notable for the following components:   WBC 13.1 (*)    Hemoglobin 11.4 (*)    MCV 78.4 (*)    MCH 23.7 (*)    Platelets 507 (*)    All other components within normal limits  CBG MONITORING, ED - Abnormal; Notable for the following components:   Glucose-Capillary 53 (*)    All other components within normal limits  CBG MONITORING, ED - Abnormal; Notable for the following components:   Glucose-Capillary 155 (*)    All other components within normal limits  ETHANOL  RAPID URINE DRUG SCREEN, HOSP PERFORMED  ACETAMINOPHEN LEVEL  PREGNANCY, URINE  ACETAMINOPHEN LEVEL  POC URINE PREG, ED    EKG EKG Interpretation  Date/Time:  Monday January 21 2020 21:24:46 EDT Ventricular Rate:  117 PR Interval:    QRS Duration: 81 QT Interval:  300 QTC Calculation: 419 R Axis:   20 Text Interpretation: Sinus tachycardia Probable left atrial enlargement Confirmed by Vanetta Mulders [phone]) on 01/21/2020 9:44:49 PM   Radiology No results found.  Procedures Procedures (including critical care time)  CRITICAL CARE Performed by: Vanetta Mulders Total critical care time: 60 minutes Critical care time was exclusive of separately billable procedures and treating other patients. Critical care was necessary to treat or prevent imminent or life-threatening deterioration. Critical care was time spent personally by me on the following activities: development of treatment plan with patient and/or surrogate as well as nursing, discussions with consultants, evaluation of patient's response to treatment, examination of patient, obtaining  history from patient or surrogate, ordering and performing treatments and interventions, ordering and review of laboratory studies, ordering and review of radiographic studies, pulse oximetry and re-evaluation of patient's condition.   Medications Ordered in ED Medications  dextrose 5 %-0.9 % sodium chloride infusion (has no administration in time range)  sodium chloride 0.9 % bolus 1,000 mL (1,000 mLs Intravenous New Bag/Given 01/21/20 2204)  LORazepam (ATIVAN) injection 1 mg (1 mg Intravenous Given 01/21/20 2204)  dextrose 50 % solution 50 mL (50 mLs Intravenous Given 01/21/20 2228)    ED Course  I have reviewed the triage vital signs and the nursing notes.  Pertinent labs & imaging results that were available during my care of the patient were reviewed by me and considered in my medical decision making (see chart for details).    MDM Rules/Calculators/A&P                          Patient had a hypoglycemic episode after initial blood sugar of 100 about an hour later it was down to 35.  Patient given an amp of D50 started on D5 normal saline.  Patient also given fluids to drink.  Including fluids with sugar.  Patient given some Ativan to prevent seizures.  Initial cardiac monitoring showed some PVCs initial EKG had a sinus tachycardia.  Monitor at 1 point did get heart rate up to the 140s.  But now down below 100.  As stated poison control recommends observation for 8 hours L before in the morning.  We will do a repeat Tylenol level here at 2330.  In addition patient will need follow-up for the hypoglycemia.  Which after the amp of D50 had resolved.  We will continue IV fluids we will continue cardiac monitoring  Urine drug screen still pending.  Labs without significant abnormalities.  Which is reassuring.  Patient has remained alert and oriented.  Patient currently prefer Pers to be voluntary.  She does not want an IVC.  If medically cleared patient will require consultation by  behavioral health for significant overdose attempt.  Patient will be turned over to the evening physician.   Final Clinical Impression(s) / ED Diagnoses Final diagnoses:  Intentional drug overdose, initial encounter (HCC)  Hypoglycemia  Suicidal ideation    Rx / DC Orders ED Discharge Orders    None       Vanetta Mulders, MD 01/21/20 2341

## 2020-01-21 NOTE — ED Notes (Signed)
Per poison control pt needs to be on cardiac monitor a minimum of 8 hours. Pt needs ekg now and then before she is medically cleared. They recommend iv fluid and benzos if needed for seizures. They also recommend repeat tylenol level around 2330. Spoke with Patty a poison control and edp notified of recommendations.

## 2020-01-22 DIAGNOSIS — T424X1A Poisoning by benzodiazepines, accidental (unintentional), initial encounter: Secondary | ICD-10-CM | POA: Diagnosis not present

## 2020-01-22 DIAGNOSIS — T43622A Poisoning by amphetamines, intentional self-harm, initial encounter: Secondary | ICD-10-CM | POA: Diagnosis not present

## 2020-01-22 DIAGNOSIS — Z20822 Contact with and (suspected) exposure to covid-19: Secondary | ICD-10-CM | POA: Diagnosis not present

## 2020-01-22 DIAGNOSIS — R45851 Suicidal ideations: Secondary | ICD-10-CM | POA: Diagnosis not present

## 2020-01-22 DIAGNOSIS — Z87891 Personal history of nicotine dependence: Secondary | ICD-10-CM | POA: Diagnosis not present

## 2020-01-22 DIAGNOSIS — T407X2A Poisoning by cannabis (derivatives), intentional self-harm, initial encounter: Secondary | ICD-10-CM | POA: Diagnosis not present

## 2020-01-22 LAB — SARS CORONAVIRUS 2 BY RT PCR (HOSPITAL ORDER, PERFORMED IN ~~LOC~~ HOSPITAL LAB): SARS Coronavirus 2: NEGATIVE

## 2020-01-22 LAB — ACETAMINOPHEN LEVEL
Acetaminophen (Tylenol), Serum: <10 ug/mL — ABNORMAL LOW (ref 10–30)
Acetaminophen (Tylenol), Serum: <10 ug/mL — ABNORMAL LOW (ref 10–30)

## 2020-01-22 MED ORDER — ONDANSETRON 8 MG PO TBDP
8.0000 mg | ORAL_TABLET | Freq: Once | ORAL | Status: AC
  Administered 2020-01-22: 8 mg via ORAL
  Filled 2020-01-22: qty 1

## 2020-01-22 MED ORDER — PROMETHAZINE HCL 25 MG/ML IJ SOLN
12.5000 mg | Freq: Once | INTRAMUSCULAR | Status: DC
  Filled 2020-01-22: qty 1

## 2020-01-22 MED ORDER — HYDROXYZINE HCL 25 MG PO TABS
25.0000 mg | ORAL_TABLET | Freq: Four times a day (QID) | ORAL | Status: DC | PRN
  Administered 2020-01-22 – 2020-01-23 (×2): 25 mg via ORAL
  Filled 2020-01-22 (×2): qty 1

## 2020-01-22 NOTE — Care Management (Signed)
  Writer referred patient to the following facilities:  [facility]  [facility]  [facility]  [facility]  [facility]  [facility]  [facility]  [facility]  [facility]  [facility]

## 2020-01-22 NOTE — ED Notes (Signed)
Pt requesting phone to write numbers down. RN approved. Pt given phone, a marker, and a piece of paper to write down numbers. When done, this NT placed phone back into pt locker.

## 2020-01-22 NOTE — ED Notes (Signed)
[facility] notified that pt is medically cleared

## 2020-01-22 NOTE — ED Notes (Signed)
Pt transferred from chair to bed; extremely unsteady on feet. Assisted pt back to bed

## 2020-01-22 NOTE — ED Notes (Signed)
Pt vomiting in room, edp made aware.

## 2020-01-22 NOTE — ED Notes (Signed)
Pts husband in there at this time

## 2020-01-22 NOTE — ED Notes (Signed)
ED Provider at bedside. 

## 2020-01-22 NOTE — ED Notes (Signed)
Pt is crying, crawling out of the end of bed, assisted to restroom. After restroom, pt requesting to speak with Chiropodist. Pt states everyone is treating her like a child. Pt repeatedly stating "I am not a threat to anybody else". Reassured pt we are concerned about her and want to help her. Suicidal precautions reviewed.

## 2020-01-22 NOTE — ED Notes (Signed)
Talk to poison control. Pt is medically cleared.

## 2020-01-22 NOTE — BH Assessment (Signed)
Comprehensive Clinical Assessment (CCA) Note  01/22/2020 Sonya Santiago [id number] -Clinician reviewed note by Dr. Deretha Emory.  Patient brought in by EMS.  Intentional overdose.  Patient states that around 8 PM in a suicide attempt she took a mixture of Prozac Vyvanse lorazepam oxycodone.  Patient states she mixed them altogether in 1 bottle and took them.  She also admits to self cutting for the past 2 weeks.  [name] was buried today patient was involved in an argument with her mother.  Patient did admit to EMS she planned on getting her dad's gun and shooting herself.  Patient was asked what her intention was when she took all this medication and she said, "to die."  Patient says "I still kinda feel that way."    Pt denies any HI or A/v hallucinations.  Pt says she drinks ETOH about once in a month or every couple weeks and not much at that.  Pt does admit to drinking ETOH tonight.  Patient says that she has been feeling suicidal for a few weeks.  When her grandmother died she was in contact with her parents.  She does not have a good relationship with them.  She and mother argued today.  Grandmother's funeral was yesterday.  Patient admits to making cuts to herself within the last 2 weeks.   Patient does not have a psychiatrist at this time.  Her meds are prescribed by PCP.  Patient has no previous inpatient care experience.    Pt is sitting up in bed and her head lolls about like she has poor muscle control.  She is oriented x4.  Patient is not responding to internal stimuli.  Pt thought process is logical and coherent.  She reports decreased sleep and appetite.    -Clinician discussed patient care with Nira Conn, FNP who recommends inpatient psychiatric care.  Clinician informed Dr. Clayborne Dana of disposition.  Patient will be referred out by TTS since there are no appropriate beds at [facility].  Visit Diagnosis:      ICD-10-CM   1. Intentional drug overdose, initial encounter (HCC)   T50.902A   2. Hypoglycemia  E16.2   3. Suicidal ideation  R45.851       CCA Screening, Triage and Referral (STR)  Patient Reported Information How did you hear about [place]? Family/Friend  Referral name: Pt's husband called EMS.  Referral phone number: No data recorded  Whom do you see for routine medical problems? Primary Care  Practice/Facility Name: Dr. Christene Lye Musselbower in [place]  Practice/Facility Phone Number: No data recorded Name of Contact: No data recorded Contact Number: No data recorded Contact Fax Number: No data recorded Prescriber Name: No data recorded Prescriber Address (if known): No data recorded  What Is the Reason for Your Visit/Call Today? Pt intentionally took an overdose in an effort to kill herself.  Ingestion was arount 20:00 on 07/05.  How Long Has This Been Causing You Problems? 1-6 months  What Do You Feel Would Help You the Most Today? Assessment Only   Have You Recently Been in Any Inpatient Treatment (Hospital/Detox/Crisis Center/28-Day Program)? No  Name/Location of Program/Hospital:No data recorded How Long Were You There? No data recorded When Were You Discharged? No data recorded  Have You Ever Received Services From [facility] Before? Yes  Who Do You See at [facility]? Pt had a ED visit in 06/2019   Have You Recently Had Any Thoughts About Hurting Yourself? Yes  Are You Planning to Commit Suicide/Harm Yourself At This time?  Yes   Have you Recently Had Thoughts About Hurting Someone Karolee Ohs? No  Explanation: No data recorded  Have You Used Any Alcohol or Drugs in the Past 24 Hours? Yes  How Long Ago Did You Use Drugs or Alcohol? 1930  What Did You Use and How Much? About small glass of tequila   Do You Currently Have a Therapist/Psychiatrist? No  Name of Therapist/Psychiatrist: No data recorded  Have You Been Recently Discharged From Any Office Practice or Programs? No  Explanation of Discharge From  Practice/Program: No data recorded    CCA Screening Triage Referral Assessment Type of Contact: Tele-Assessment  Is this Initial or Reassessment? Initial Assessment  Date Telepsych consult ordered in [facility]:  01/22/20  Time Telepsych consult ordered in [facility]:  0428   Patient Reported Information Reviewed? Yes  Patient Left Without Being Seen? No data recorded Reason for Not Completing Assessment: No data recorded  Collateral Involvement: No data recorded  Does Patient Have a Court Appointed Legal Guardian? No data recorded Name and Contact of Legal Guardian: No data recorded If Minor and Not Living with Parent(s), Who has Custody? No data recorded Is CPS involved or ever been involved? No data recorded Is APS involved or ever been involved? No data recorded  Patient Determined To Be At Risk for Harm To Self or Others Based on Review of Patient Reported Information or Presenting Complaint? Yes, for Self-Harm  Method: No data recorded Availability of Means: No data recorded Intent: No data recorded Notification Required: No data recorded Additional Information for Danger to Others Potential: No data recorded Additional Comments for Danger to Others Potential: No data recorded Are There Guns or Other Weapons in Your Home? No data recorded Types of Guns/Weapons: No data recorded Are These Weapons Safely Secured?                            No data recorded Who Could Verify You Are Able To Have These Secured: No data recorded Do You Have any Outstanding Charges, Pending Court Dates, Parole/Probation? No data recorded Contacted To Inform of Risk of Harm To Self or Others: No data recorded  Location of Assessment: [facility]   Does Patient Present under Involuntary Commitment? No  IVC Papers Initial File Date: No data recorded  [place] of Residence: [place]   Patient Currently Receiving the Following Services: Not Receiving Services   Determination of Need: Emergent (2  hours)   Options For Referral: Inpatient Hospitalization     CCA Biopsychosocial  Intake/Chief Complaint:  CCA Intake With Chief Complaint CCA Part Two Date: 01/22/20 CCA Part Two Time: 0618 Chief Complaint/Presenting Problem: Patient took overdose of medications with intention go die. Patient's Currently Reported Symptoms/Problems: Attempted overdose Type of Services Patient Feels Are Needed: Inpatient care Initial Clinical Notes/Concerns: Pt overdosed on medicine  Mental Health Symptoms Depression:  Depression: Change in energy/activity, Hopelessness, Worthlessness, Irritability, Duration of symptoms greater than two weeks  Mania:     Anxiety:   Anxiety: Restlessness, Difficulty concentrating  Psychosis:  Psychosis: None  Trauma:     Obsessions:     Compulsions:     Inattention:  Inattention: Disorganized, Fails to pay attention/makes careless mistakes  Hyperactivity/Impulsivity:     Oppositional/Defiant Behaviors:     Emotional Irregularity:  Emotional Irregularity: Chronic feelings of emptiness, Potentially harmful impulsivity  Other Mood/Personality Symptoms:      Mental Status Exam Appearance and self-care  Stature:  Stature: Average  Weight:  Weight: Overweight  Clothing:     Grooming:     Cosmetic use:     Posture/gait:  Posture/Gait: Normal  Motor activity:     Sensorium  Attention:  Attention: Normal  Concentration:     Orientation:  Orientation: X5  Recall/memory:  Recall/Memory: Defective in Short-term  Affect and Mood  Affect:  Affect: Depressed  Mood:  Mood: Depressed  Relating  Eye contact:  Eye Contact: Normal  Facial expression:  Facial Expression: Anxious, Sad  Attitude toward examiner:     Thought and Language  Speech flow:    Thought content:     Preoccupation:     Hallucinations:  Hallucinations: None  Organization:     Company secretary of Knowledge:  Fund of Knowledge: Average  Intelligence:  Intelligence: Average   Abstraction:  Abstraction: Normal  Judgement:  Judgement: Impaired  Reality Testing:     Insight:  Insight: Fair  Decision Making:  Decision Making: Impulsive  Social Functioning  Social Maturity:     Social Judgement:     Stress  Stressors:  Stressors: Grief/losses, Relationship  Coping Ability:  Coping Ability: Building surveyor Deficits:  Skill Deficits: Decision making  Supports:  Supports: Friends/Service system     Religion:    Leisure/Recreation:    Exercise/Diet:     CCA Employment/Education  Employment/Work Situation:    Education:     CCA Family/Childhood History  Family and Relationship History:    Childhood History:     Child/Adolescent Assessment:     CCA Substance Use  Alcohol/Drug Use: Alcohol / Drug Use Pain Medications: None Prescriptions: See PTA medication list Over the Counter: None                         ASAM's:  Six Dimensions of Multidimensional Assessment  Dimension 1:  Acute Intoxication and/or Withdrawal Potential:      Dimension 2:  Biomedical Conditions and Complications:      Dimension 3:  Emotional, Behavioral, or Cognitive Conditions and Complications:     Dimension 4:  Readiness to Change:     Dimension 5:  Relapse, Continued use, or Continued Problem Potential:     Dimension 6:  Recovery/Living Environment:     ASAM Severity Score:    ASAM Recommended Level of Treatment:     Substance use Disorder (SUD)    Recommendations for Services/Supports/Treatments:    DSM5 Diagnoses: Patient Active Problem List   Diagnosis Date Noted  . Binge eating disorder 12/25/2019  . Pre-diabetes 11/15/2018  . Gastroesophageal reflux disease without esophagitis 11/15/2018  . Morbid obesity (HCC) 11/15/2018  . Infertility, anovulation 08/30/2016  . Anovulation 08/09/2016    Patient Centered Plan: Patient is on the following Treatment Plan(s):  Depression   Referrals to Alternative Service(s): Referred  to Alternative Service(s):   Place:   Date:   Time:    Referred to Alternative Service(s):   Place:   Date:   Time:    Referred to Alternative Service(s):   Place:   Date:   Time:    Referred to Alternative Service(s):   Place:   Date:   Time:     [place]

## 2020-01-23 DIAGNOSIS — T43622A Poisoning by amphetamines, intentional self-harm, initial encounter: Secondary | ICD-10-CM | POA: Diagnosis not present

## 2020-01-23 DIAGNOSIS — T424X1A Poisoning by benzodiazepines, accidental (unintentional), initial encounter: Secondary | ICD-10-CM | POA: Diagnosis not present

## 2020-01-23 DIAGNOSIS — T407X2A Poisoning by cannabis (derivatives), intentional self-harm, initial encounter: Secondary | ICD-10-CM | POA: Diagnosis not present

## 2020-01-23 DIAGNOSIS — Z87891 Personal history of nicotine dependence: Secondary | ICD-10-CM | POA: Diagnosis not present

## 2020-01-23 DIAGNOSIS — Z20822 Contact with and (suspected) exposure to covid-19: Secondary | ICD-10-CM | POA: Diagnosis not present

## 2020-01-23 DIAGNOSIS — R45851 Suicidal ideations: Secondary | ICD-10-CM | POA: Diagnosis not present

## 2020-01-23 MED ORDER — PANTOPRAZOLE SODIUM 40 MG PO TBEC
40.0000 mg | DELAYED_RELEASE_TABLET | Freq: Every day | ORAL | Status: DC
  Administered 2020-01-23: 40 mg via ORAL
  Filled 2020-01-23: qty 1

## 2020-01-23 NOTE — ED Notes (Signed)
Husband visiting with patient at this time. PT alert, oriented, cooperative this am. PT stated "I know I need to stay and get help for my depression." and is tearful telling me this statement this am.

## 2020-01-23 NOTE — ED Notes (Signed)
Pt is using phone to call family at this moment.

## 2020-01-23 NOTE — ED Notes (Signed)
Pt took a shower. Got dressed and brushed teeth. Clean sheets on bed. Pt calm and cooperative in room.

## 2020-01-23 NOTE — ED Notes (Signed)
Pt's husband is back to visit

## 2020-01-23 NOTE — ED Notes (Signed)
Pt received a lunch tray. Sitting on the side of the bed eating lunch

## 2020-01-23 NOTE — ED Notes (Signed)
Patient is asleep at this time. Patient in no distress. Equal rise and fall of chest, equal respirations.

## 2020-01-23 NOTE — ED Notes (Signed)
Pt husband is visiting.

## 2020-01-23 NOTE — ED Notes (Signed)
Patient still asleep 

## 2020-01-23 NOTE — ED Notes (Signed)
Pt's husband has left.. pt resting in room and is calm

## 2020-01-23 NOTE — ED Notes (Signed)
Pt was given a soda.

## 2020-01-23 NOTE — ED Notes (Signed)
Pt's husband is visiting

## 2020-01-23 NOTE — ED Notes (Signed)
Pt had a good day no outburst or problems. She has been calm and easy going.

## 2020-01-23 NOTE — ED Notes (Signed)
Pt is sleeping will get vitals when she wakes up

## 2020-01-23 NOTE — ED Notes (Signed)
Pt has breakfast tray and is eating breakfast.

## 2020-01-23 NOTE — ED Notes (Signed)
Pt is using phone at this moment

## 2020-01-24 ENCOUNTER — Other Ambulatory Visit: Payer: Self-pay

## 2020-01-24 ENCOUNTER — Encounter (HOSPITAL_COMMUNITY): Payer: Self-pay | Admitting: Endocrinology

## 2020-01-24 ENCOUNTER — Inpatient Hospital Stay (HOSPITAL_COMMUNITY)
Admit: 2020-01-24 | Discharge: 2020-01-26 | DRG: 918 | Disposition: A | Discharge status: Home / Self Care | Payer: 59 | Source: Intra-hospital | Attending: Psychiatry | Admitting: Psychiatry

## 2020-01-24 DIAGNOSIS — Z87891 Personal history of nicotine dependence: Secondary | ICD-10-CM

## 2020-01-24 DIAGNOSIS — Z20822 Contact with and (suspected) exposure to covid-19: Secondary | ICD-10-CM | POA: Diagnosis present

## 2020-01-24 DIAGNOSIS — T43221A Poisoning by selective serotonin reuptake inhibitors, accidental (unintentional), initial encounter: Secondary | ICD-10-CM | POA: Diagnosis present

## 2020-01-24 DIAGNOSIS — K219 Gastro-esophageal reflux disease without esophagitis: Secondary | ICD-10-CM | POA: Diagnosis present

## 2020-01-24 DIAGNOSIS — Z915 Personal history of self-harm: Secondary | ICD-10-CM | POA: Diagnosis not present

## 2020-01-24 DIAGNOSIS — F332 Major depressive disorder, recurrent severe without psychotic features: Secondary | ICD-10-CM | POA: Diagnosis not present

## 2020-01-24 DIAGNOSIS — G4700 Insomnia, unspecified: Secondary | ICD-10-CM | POA: Diagnosis present

## 2020-01-24 DIAGNOSIS — F329 Major depressive disorder, single episode, unspecified: Secondary | ICD-10-CM | POA: Diagnosis present

## 2020-01-24 DIAGNOSIS — F411 Generalized anxiety disorder: Secondary | ICD-10-CM | POA: Diagnosis present

## 2020-01-24 DIAGNOSIS — Z79899 Other long term (current) drug therapy: Secondary | ICD-10-CM

## 2020-01-24 DIAGNOSIS — T50902A Poisoning by unspecified drugs, medicaments and biological substances, intentional self-harm, initial encounter: Secondary | ICD-10-CM | POA: Diagnosis present

## 2020-01-24 HISTORY — DX: Prediabetes: R73.03

## 2020-01-24 HISTORY — DX: Gastro-esophageal reflux disease without esophagitis: K21.9

## 2020-01-24 HISTORY — DX: Major depressive disorder, single episode, unspecified: F32.9

## 2020-01-24 MED ORDER — LOPERAMIDE HCL 2 MG PO CAPS: 2.0000 mg | ORAL_CAPSULE | ORAL | Status: DC | PRN

## 2020-01-24 MED ORDER — ONDANSETRON 4 MG PO TBDP: 4.0000 mg | ORAL_TABLET | Freq: Four times a day (QID) | ORAL | Status: DC | PRN

## 2020-01-24 MED ORDER — DULOXETINE HCL 30 MG PO CPEP
30.0000 mg | ORAL_CAPSULE | Freq: Every day | ORAL | Status: DC
  Administered 2020-01-24 – 2020-01-25 (×2): 30 mg via ORAL
  Filled 2020-01-24 (×4): qty 1

## 2020-01-24 MED ORDER — HYDROXYZINE HCL 25 MG PO TABS
25.0000 mg | ORAL_TABLET | Freq: Four times a day (QID) | ORAL | Status: DC | PRN
  Filled 2020-01-24: qty 1

## 2020-01-24 MED ORDER — THIAMINE HCL 100 MG/ML IJ SOLN: 100.0000 mg | Freq: Once | INTRAMUSCULAR | Status: DC

## 2020-01-24 MED ORDER — THIAMINE HCL 100 MG PO TABS
100.0000 mg | ORAL_TABLET | Freq: Every day | ORAL | Status: DC
  Administered 2020-01-25 – 2020-01-26 (×2): 100 mg via ORAL
  Filled 2020-01-24 (×4): qty 1

## 2020-01-24 MED ORDER — ADULT MULTIVITAMIN W/MINERALS CH
1.0000 | ORAL_TABLET | Freq: Every day | ORAL | Status: DC
  Administered 2020-01-24 – 2020-01-26 (×3): 1 via ORAL
  Filled 2020-01-24 (×5): qty 1

## 2020-01-24 MED ORDER — PANTOPRAZOLE SODIUM 40 MG PO TBEC
40.0000 mg | DELAYED_RELEASE_TABLET | Freq: Every day | ORAL | Status: DC
  Administered 2020-01-25 – 2020-01-26 (×2): 40 mg via ORAL
  Filled 2020-01-24 (×5): qty 1

## 2020-01-24 MED ORDER — LORAZEPAM 1 MG PO TABS: 1.0000 mg | ORAL_TABLET | Freq: Four times a day (QID) | ORAL | Status: DC | PRN

## 2020-01-24 MED ORDER — HYDROXYZINE HCL 25 MG PO TABS
25.0000 mg | ORAL_TABLET | Freq: Four times a day (QID) | ORAL | Status: DC | PRN
  Administered 2020-01-24: 25 mg via ORAL
  Filled 2020-01-24: qty 1
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
## 2020-01-24 NOTE — Progress Notes (Signed)
   01/24/20 0225  COVID-19 Daily Checkoff  Have you had a fever (temp > 37.80C/100F)  in the past 24 hours?  No  COVID-19 EXPOSURE  Have you traveled outside the state in the past 14 days? No  Have you been in contact with someone with a confirmed diagnosis of COVID-19 or PUI in the past 14 days without wearing appropriate PPE? No  Have you been living in the same home as a person with confirmed diagnosis of COVID-19 or a PUI (household contact)? No  Have you been diagnosed with COVID-19? No

## 2020-01-24 NOTE — Progress Notes (Signed)
   01/24/20 0800  Psych Admission Type (Psych Patients Only)  Admission Status Voluntary  Psychosocial Assessment  Patient Complaints Anxiety;Depression  Eye Contact Brief  Facial Expression Flat  Affect Depressed  Speech Logical/coherent  Interaction Guarded  Motor Activity Other (Comment) (WNL)  Appearance/Hygiene Unremarkable  Behavior Characteristics Cooperative  Mood Depressed  Thought Process  Coherency WDL  Content WDL  Delusions None reported or observed  Perception WDL  Hallucination None reported or observed  Judgment Impaired  Confusion None  Danger to Self  Current suicidal ideation? Denies  Danger to Others  Danger to Others None reported or observed   Pt has been in her room nauseated and vomiting this morning. She was unable to take 0800 protonix due to upset stomach. MD made aware. Gave Gatorade, Gingerale and orange/ginger aromatherapy. Pt is currently resting in bed. Respirations are even and unlabored. Safety maintained on the unit.

## 2020-01-24 NOTE — Progress Notes (Signed)
Spoke with Tiffany and informed pt has been assigned bed 301-1 at [facility]. Pt can arrive any time. Call report to [phone]

## 2020-01-24 NOTE — Progress Notes (Signed)
   01/24/20 0225  Psych Admission Type (Psych Patients Only)  Admission Status Voluntary  Psychosocial Assessment  Patient Complaints Anxiety;Decreased concentration;Crying spells;Depression;Hopelessness;Sadness;Self-harm thoughts;Sleep disturbance;Worrying  Eye Contact Brief  Facial Expression Flat;Sad  Affect Depressed;Flat  Speech Logical/coherent;Soft  Interaction Guarded;Minimal;Forwards little  Motor Activity Other (Comment) (WDL)  Appearance/Hygiene Unremarkable  Behavior Characteristics Cooperative;Appropriate to situation;Anxious;Calm  Mood Depressed;Anxious;Sad  Thought Process  Coherency WDL  Content WDL  Delusions None reported or observed  Perception WDL  Hallucination None reported or observed  Judgment Impaired  Confusion None  Danger to Self  Current suicidal ideation? Passive  Self-Injurious Behavior No self-injurious ideation or behavior indicators observed or expressed   Agreement Not to Harm Self Yes  Description of Agreement verbally contracts for safety  Danger to Others  Danger to Others None reported or observed
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
## 2020-01-24 NOTE — Progress Notes (Signed)
Report received from Tiffany, RN at [facility] on pt coming to [facility] [phone].

## 2020-01-24 NOTE — ED Notes (Signed)
RN Casimiro Needle walked patient to restroom
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
## 2020-01-24 NOTE — ED Notes (Signed)
Patient rolled over spoke to this nurse tech. Patient went back to sleep.

## 2020-01-24 NOTE — ED Notes (Signed)
RN Caitey in room with patient at this time.

## 2020-01-24 NOTE — Progress Notes (Signed)
   01/24/20 2300  Psych Admission Type (Psych Patients Only)  Admission Status Voluntary  Psychosocial Assessment  Patient Complaints Anxiety;Depression  Eye Contact Brief  Facial Expression Flat  Affect Depressed  Speech Logical/coherent  Interaction Guarded  Motor Activity Other (Comment) (WNL)  Appearance/Hygiene Unremarkable  Behavior Characteristics Cooperative  Mood Depressed  Thought Process  Coherency WDL  Content WDL  Delusions None reported or observed  Perception WDL  Hallucination None reported or observed  Judgment Impaired  Confusion None  Danger to Self  Current suicidal ideation? Denies  Danger to Others  Danger to Others None reported or observed

## 2020-01-25 LAB — HEMOGLOBIN A1C
Hgb A1c MFr Bld: 6 % — ABNORMAL HIGH (ref 4.8–5.6)
Mean Plasma Glucose: 125.5 mg/dL

## 2020-01-25 LAB — TSH: TSH: 0.736 u[IU]/mL (ref 0.350–4.500)

## 2020-01-25 LAB — LIPID PANEL
Cholesterol: 218 mg/dL — ABNORMAL HIGH (ref 0–200)
HDL: 39 mg/dL — ABNORMAL LOW (ref >40)
LDL Cholesterol: 148 mg/dL — ABNORMAL HIGH (ref 0–99)
Total CHOL/HDL Ratio: 5.6 RATIO
Triglycerides: 156 mg/dL — ABNORMAL HIGH (ref <150)
VLDL: 31 mg/dL (ref 0–40)

## 2020-01-25 MED ORDER — DULOXETINE HCL 20 MG PO CPEP
40.0000 mg | ORAL_CAPSULE | Freq: Every day | ORAL | Status: DC
  Administered 2020-01-26: 40 mg via ORAL
  Filled 2020-01-25 (×3): qty 2

## 2020-01-25 NOTE — Progress Notes (Signed)
Patient observed lying in bed reading. She reported that she was hoping to discharge on today but didn't. She reports wanting to return home to her husband and have her own personal space since having a roommate. She was pleasant and did not request any medications. Hoping to discharge tomorrow. Safety maintained with 15 min checks.

## 2020-01-25 NOTE — Progress Notes (Signed)
°   01/25/20 2115  COVID-19 Daily Checkoff  Have you had a fever (temp > 37.80C/100F)  in the past 24 hours?  No  If you have had runny nose, nasal congestion, sneezing in the past 24 hours, has it worsened? No  COVID-19 EXPOSURE  Have you traveled outside the state in the past 14 days? No  Have you been living in the same home as a person with confirmed diagnosis of COVID-19 or a PUI (household contact)? No  Have you been diagnosed with COVID-19? No

## 2020-01-25 NOTE — Progress Notes (Signed)
[facility] Progress Note  01/25/2020 4:08 PM Sonya HubertDeanna Santiago  MRN:  [id number] Subjective:   Pt is a 24 years old female with past history of Depression and Anxiety [facility] attempt by intentional overdose on 01/21/2020. Pt took some pills of Prozac, vyvanse, lorazepam, oxycodone and fluoxetine tablets. Pt was found unresponsive at home by her husband who called 911. Pt has been feeling depressed and sad off and on for about 2 years. Decision was made [facility]. Pt was seen and examined today. Pt is doing much better today. Pt states she feels good and regret her decision to take pills. Currently, Pt denies any suicidal ideation, homicidal ideation and, visual and auditory hallucination. Pt denies any paranoia. Pt states her sleep and appetite are much better than yesterday. Pt denies decreased interest in activities, and energy. Pt denies racing thoughts, increased distraction and irritability. Pt is hopeful for the future. Pt denies hopelessness, worthlessness, guilt and anhedonia. Pt denies any side effects from medications. Pt states she is feeling ok and wants to go home.    Principal Problem: <principal problem not specified> Diagnosis: Active Problems:   MDD (major depressive disorder)  Total Time spent with patient: 20 minutes  Past Psychiatric History: Depression, Anxiety  Past Medical History:  Past Medical History:  Diagnosis Date   Allergy    Anxiety    GERD (gastroesophageal reflux disease)    Pre-diabetes     Past Surgical History:  Procedure Laterality Date   TONSILLECTOMY AND ADENOIDECTOMY     WISDOM TOOTH EXTRACTION     Family History:  Family History  Problem Relation Age of Onset   Cancer Mother        Breast, Thyroid   Asthma Brother    Cancer Maternal Grandmother    Diabetes Maternal Grandfather    Heart disease Maternal Grandfather    Diabetes Paternal Grandmother    Cirrhosis  Paternal Grandfather    Family Psychiatric  History: None Social History:  Social History   Substance and Sexual Activity  Alcohol Use Yes   Alcohol/week: 0.0 standard drinks   Comment: last drink was on Monday (2 shots of tequila), drinks 2-3 days a week and can be up to an entire bottle of tequila     Social History   Substance and Sexual Activity  Drug Use Yes   Types: Other-see comments   Comment: weed gummies/pen, "one gummy in past 2 weeks," "delta 8"    Social History   Socioeconomic History   Marital status: Married    Spouse name: Not on file   Number of children: Not on file   Years of education: Not on file   Highest education level: Not on file  Occupational History   Not on file  Tobacco Use   Smoking status: Former Smoker    Packs/day: 0.10    Start date: 08/13/2013    Quit date: 07/12/2014    Years since quitting: 5.5   Smokeless tobacco: Never Used  Vaping Use   Vaping Use: Some days   Devices: vaping 2-3 times a week  Substance and Sexual Activity   Alcohol use: Yes    Alcohol/week: 0.0 standard drinks    Comment: last drink was on Monday (2 shots of tequila), drinks 2-3 days a week and can be up to an entire bottle of tequila   Drug use: Yes    Types: Other-see comments  Comment: weed gummies/pen, "one gummy in past 2 weeks," "delta 8"   Sexual activity: Yes    Birth control/protection: None  Other Topics Concern   Not on file  Social History Narrative   Not on file   Social Determinants of Health   Financial Resource Strain:    Difficulty of Paying Living Expenses:   Food Insecurity:    Worried About Programme researcher, broadcasting/film/video in the Last Year:    Barista in the Last Year:   Transportation Needs:    Freight forwarder (Medical):    Lack of Transportation (Non-Medical):   Physical Activity:    Days of Exercise per Week:    Minutes of Exercise per Session:   Stress:    Feeling of Stress :   Social  Connections:    Frequency of Communication with Friends and Family:    Frequency of Social Gatherings with Friends and Family:    Attends Religious Services:    Active Member of Clubs or Organizations:    Attends Banker Meetings:    Marital Status:    Additional Social History:                         Sleep: Good  Appetite:  Good  Current Medications: Current Facility-Administered Medications  Medication Dose Route Frequency Provider Last Rate Last Admin   [START ON 01/26/2020] DULoxetine (CYMBALTA) DR capsule 40 mg  40 mg Oral Daily Cobos, Rockey Situ, MD       hydrOXYzine (ATARAX/VISTARIL) tablet 25 mg  25 mg Oral Q6H PRN Cobos, Rockey Situ, MD       LORazepam (ATIVAN) tablet 1 mg  1 mg Oral Q6H PRN Cobos, Rockey Situ, MD       multivitamin with minerals tablet 1 tablet  1 tablet Oral Daily Cobos, Rockey Situ, MD   1 tablet at 01/25/20 0809   ondansetron (ZOFRAN-ODT) disintegrating tablet 4 mg  4 mg Oral Q6H PRN Cobos, Rockey Situ, MD       pantoprazole (PROTONIX) EC tablet 40 mg  40 mg Oral Daily Denzil Magnuson, NP   40 mg at 01/25/20 0809   thiamine tablet 100 mg  100 mg Oral Daily Cobos, Rockey Situ, MD   100 mg at 01/25/20 8185    Lab Results:  Results for orders placed or performed during the hospital encounter of 01/24/20 (from the past 48 hour(s))  Hemoglobin A1c     Status: Abnormal   Collection Time: 01/25/20  6:38 AM  Result Value Ref Range   Hgb A1c MFr Bld 6.0 (H) 4.8 - 5.6 %    Comment: (NOTE) Pre diabetes:          5.7%-6.4%  Diabetes:              >6.4%  Glycemic control for   <7.0% adults with diabetes    Mean Plasma Glucose 125.5 mg/dL    Comment: Performed at [facility], [address], [place], [place] [address]  Lipid panel     Status: Abnormal   Collection Time: 01/25/20  6:38 AM  Result Value Ref Range   Cholesterol 218 (H) 0 - 200 mg/dL   Triglycerides 702 (H) <150 mg/dL   HDL 39 (L) >63 mg/dL   Total  CHOL/HDL Ratio 5.6 RATIO   VLDL 31 0 - 40 mg/dL   LDL Cholesterol 785 (H) 0 - 99 mg/dL    Comment:  Total Cholesterol/HDL:CHD Risk Coronary Heart Disease Risk Table                     Men   Women  1/2 Average Risk   3.4   3.3  Average Risk       5.0   4.4  2 X Average Risk   9.6   7.1  3 X Average Risk  23.4   11.0        Use the calculated Patient Ratio above and the CHD Risk Table to determine the patient's CHD Risk.        ATP III CLASSIFICATION (LDL):  <100     mg/dL   Optimal  287-681  mg/dL   Near or Above                    Optimal  130-159  mg/dL   Borderline  157-262  mg/dL   High  >035     mg/dL   Very High Performed at [facility], [address] [place], [place] [address]   TSH     Status: None   Collection Time: 01/25/20  6:38 AM  Result Value Ref Range   TSH 0.736 0.350 - 4.500 uIU/mL    Comment: Performed by a 3rd Generation assay with a functional sensitivity of <=0.01 uIU/mL. Performed at [facility], [address] [place], [place] [address]     Blood Alcohol level:  Lab Results  Component Value Date   ETH <10 01/21/2020    Metabolic Disorder Labs: Lab Results  Component Value Date   HGBA1C 6.0 (H) 01/25/2020   MPG 125.5 01/25/2020   No results found for: PROLACTIN Lab Results  Component Value Date   CHOL 218 (H) 01/25/2020   TRIG 156 (H) 01/25/2020   HDL 39 (L) 01/25/2020   CHOLHDL 5.6 01/25/2020   VLDL 31 01/25/2020   LDLCALC 148 (H) 01/25/2020   LDLCALC 145 (H) 02/22/2019    Physical Findings: AIMS: Facial and Oral Movements Muscles of Facial Expression: None, normal Lips and Perioral Area: None, normal Jaw: None, normal Tongue: None, normal,Extremity Movements Upper (arms, wrists, hands, fingers): None, normal Lower (legs, knees, ankles, toes): None, normal, Trunk Movements Neck, shoulders, hips: None, normal, Overall Severity Severity of abnormal movements (highest score from  questions above): None, normal Incapacitation due to abnormal movements: None, normal Patient's awareness of abnormal movements (rate only patient's report): No Awareness, Dental Status Current problems with teeth and/or dentures?: No Does patient usually wear dentures?: No  CIWA:  CIWA-Ar Total: 0 COWS:  COWS Total Score: 2  Musculoskeletal: Strength & Muscle Tone: within normal limits Gait & Station: normal Patient leans: N/A  Psychiatric Specialty Exam: Physical Exam  Review of Systems  Blood pressure 108/76, pulse (!) 127, temperature 98.2 F (36.8 C), temperature source Oral, resp. rate 14, height 5\' 3"  (1.6 m), weight 103.4 kg, SpO2 100 %.Body mass index is 40.39 kg/m.  General Appearance: Casual and Neat  Eye Contact:  Good  Speech:  Normal Rate  Volume:  Decreased  Mood:  Dysphoric  Affect:  Constricted  Thought Process:  Coherent  Orientation:  Full (Time, Place, and Person)  Thought Content:  Negative  Suicidal Thoughts:  No  Homicidal Thoughts:  No  Memory:  Immediate;   Fair Recent;   Fair Remote;   Fair  Judgement:  Intact  Insight:  Fair  Psychomotor Activity:  Negative  Concentration:  Concentration:  Fair  Recall:  Jennelle Human of Knowledge:  Fair  Language:  Good  Akathisia:  No  Handed:  Right  AIMS (if indicated):     Assets:  Desire for Improvement  ADL's:  Intact  Cognition:  WNL  Sleep:  Number of Hours: 1.75     Treatment Plan Summary: Pt is a 24 years old female with past history of Depression and Anxiety was transferred from [facility] ED to [facility] for Suicidal attempt by intentional overdose on 01/21/2020. Pt took some pills of Prozac, vyvanse, lorazepam, oxycodone and fluoxetine tablets.  Pt was seen and examined today. Pt is doing much better today. Pt states she feels good and regret her decision to take pills. Currently, Pt denies any suicidal ideation, homicidal ideation and, visual and auditory hallucination. Pt denies any paranoia. Pt  states her sleep and appetite are much better than yesterday. Pt denies decreased interest in activities, and energy. Pt denies racing thoughts, increased distraction and irritability. Pt is hopeful for the future. Pt denies hopelessness, worthlessness, guilt and anhedonia. Pt denies any side effects from medication. Pt states she is feeling ok and wants to go home.  U Tox was positive for THC, Benzo and Amphetamines, TSH-0.73, HbA1C- 6, Cholesterol- 218, Triglycerides- 156, HDL- 39, Glucose-125.5  Plan  Daily contact with patient to assess and evaluate symptoms and progress in treatment -Cymbalta increased to 40 mg OD -Continue Hydroxyzine 25 mg Q6H PRN -Continue Lorazepam 1 mg Q6H PRN for withdrawal symptoms. -Continue Protonix 40 mg Daily. -ContinueThiamine 100 mg. -Continue Zofran 4 mg Q6H PRN. -Monitor for withdrawal symptoms.   Karsten Ro, MD 01/25/2020, 4:08 PM

## 2020-01-25 NOTE — Plan of Care (Signed)
Progress note  D: pt found in bed; compliant with medication administration. Pt denies any physical complaints or pain. Pt seems ambivalent with their care, stating they are ready to go. Pt is minimal with assessment. Pt has been reclusive to their room, except for FaceTiming contacts. Pt denies si/hi/ah/vh and verbally agrees to approach staff if these become apparent or before harming themself/others while at [facility].  A: Pt provided support and encouragement. Pt given medication per protocol and standing orders. Q27m safety checks implemented and continued.  R: Pt safe on the unit. Will continue to monitor.  Pt progressing in the following metrics  Problem: Education: Goal: Mental status will improve Outcome: Progressing   Problem: Coping: Goal: Ability to demonstrate self-control will improve Outcome: Progressing   Problem: Health Behavior/Discharge Planning: Goal: Identification of resources available to assist in meeting health care needs will improve Outcome: Progressing   Problem: Physical Regulation: Goal: Ability to maintain clinical measurements within normal limits will improve Outcome: Progressing

## 2020-01-25 NOTE — Progress Notes (Signed)
[facility] MD Progress Note  01/25/2020 3:14 PM Sonya Santiago  MRN:  [id number] Subjective:  Patient reports she is feeling better today  and is hoping for discharge soon. Denies medication side effects. Objective: I have discussed case with treatment team and have met with patient. Have also reviewed case with Dr. Rosita Kea, psychiatric resident .  24 year old married female, employed, presented to ED on 7/5 following overdose on fluoxetine, Vyvanse, Ativan, oxycodone.  She also reported recent episode of self cutting. reported worsening depression, which have exacerbated following recent death of her grandmother.  Endorsed neurovegetative symptoms of depression.  No psychotic symptoms.  No prior psychiatric admissions.  No prior history of suicide attempts.  She does report a history of depression and anxiety.  Home medications reported as Prozac 40 mg daily, Vyvanse 30 mg daily, Xanax 0.5 mg twice daily as needed which she states she took only infrequently. She denies history of alcohol abuse but endorsed some increased alcohol consumption over recent weeks, has been drinking 2-3 times per week.  Today patient reports she is feeling better.  She denies suicidal ideations, currently presents future oriented and is hopeful for discharge soon.  She states she misses her family/husband. No disruptive or agitated behaviors on unit, polite/cooperative on approach. Currently on Cymbalta which she has tolerated well thus far ( this is a new medication for patient) she is not endorsing or presenting with symptoms of withdrawal-no tremors, no diaphoresis, does not appear to be in any acute distress, BP 123/76, pulse 76. Labs reviewed-lipid panel remarkable for slightly elevated cholesterol and triglycerides, hemoglobin A1c 6.0, TSH 0.73  Principal Problem: Depression Diagnosis: Active Problems:   MDD (major depressive disorder)  Total Time spent with patient: 20 minutes  Past Psychiatric History:   Past Medical  History:  Past Medical History:  Diagnosis Date  . Allergy   . Anxiety   . GERD (gastroesophageal reflux disease)   . Pre-diabetes     Past Surgical History:  Procedure Laterality Date  . TONSILLECTOMY AND ADENOIDECTOMY    . WISDOM TOOTH EXTRACTION     Family History:  Family History  Problem Relation Age of Onset  . Cancer Mother        Breast, Thyroid  . Asthma Brother   . Cancer Maternal Grandmother   . Diabetes Maternal Grandfather   . Heart disease Maternal Grandfather   . Diabetes Paternal Grandmother   . Cirrhosis Paternal Grandfather    Family Psychiatric  History:  Social History:  Social History   Substance and Sexual Activity  Alcohol Use Yes  . Alcohol/week: 0.0 standard drinks   Comment: last drink was on Monday (2 shots of tequila), drinks 2-3 days a week and can be up to an entire bottle of tequila     Social History   Substance and Sexual Activity  Drug Use Yes  . Types: Other-see comments   Comment: weed gummies/pen, "one gummy in past 2 weeks," "delta 8"    Social History   Socioeconomic History  . Marital status: Married    Spouse name: Not on file  . Number of children: Not on file  . Years of education: Not on file  . Highest education level: Not on file  Occupational History  . Not on file  Tobacco Use  . Smoking status: Former Smoker    Packs/day: 0.10    Start date: 08/13/2013    Quit date: 07/12/2014    Years since quitting: 5.5  . Smokeless tobacco: Never Used  Vaping Use  . Vaping Use: Some days  . Devices: vaping 2-3 times a week  Substance and Sexual Activity  . Alcohol use: Yes    Alcohol/week: 0.0 standard drinks    Comment: last drink was on Monday (2 shots of tequila), drinks 2-3 days a week and can be up to an entire bottle of tequila  . Drug use: Yes    Types: Other-see comments    Comment: weed gummies/pen, "one gummy in past 2 weeks," "delta 8"  . Sexual activity: Yes    Birth control/protection: None  Other  Topics Concern  . Not on file  Social History Narrative  . Not on file   Social Determinants of Health   Financial Resource Strain:   . Difficulty of Paying Living Expenses:   Food Insecurity:   . Worried About Charity fundraiser in the Last Year:   . Arboriculturist in the Last Year:   Transportation Needs:   . Film/video editor (Medical):   Marland Kitchen Lack of Transportation (Non-Medical):   Physical Activity:   . Days of Exercise per Week:   . Minutes of Exercise per Session:   Stress:   . Feeling of Stress :   Social Connections:   . Frequency of Communication with Friends and Family:   . Frequency of Social Gatherings with Friends and Family:   . Attends Religious Services:   . Active Member of Clubs or Organizations:   . Attends Archivist Meetings:   Marland Kitchen Marital Status:    Additional Social History:   Sleep: Fair  Appetite:  Good  Current Medications: Current Facility-Administered Medications  Medication Dose Route Frequency Provider Last Rate Last Admin  . DULoxetine (CYMBALTA) DR capsule 30 mg  30 mg Oral Daily Shamanda Len, Myer Peer, MD   30 mg at 01/25/20 0810  . hydrOXYzine (ATARAX/VISTARIL) tablet 25 mg  25 mg Oral Q6H PRN Reshonda Koerber, Myer Peer, MD      . LORazepam (ATIVAN) tablet 1 mg  1 mg Oral Q6H PRN Hollis Tuller, Myer Peer, MD      . multivitamin with minerals tablet 1 tablet  1 tablet Oral Daily Xin Klawitter, Myer Peer, MD   1 tablet at 01/25/20 0809  . ondansetron (ZOFRAN-ODT) disintegrating tablet 4 mg  4 mg Oral Q6H PRN Yaneisy Wenz, Myer Peer, MD      . pantoprazole (PROTONIX) EC tablet 40 mg  40 mg Oral Daily Mordecai Maes, NP   40 mg at 01/25/20 0809  . thiamine tablet 100 mg  100 mg Oral Daily Raydel Hosick, Myer Peer, MD   100 mg at 01/25/20 1308    Lab Results:  Results for orders placed or performed during the hospital encounter of 01/24/20 (from the past 48 hour(s))  Hemoglobin A1c     Status: Abnormal   Collection Time: 01/25/20  6:38 AM  Result Value Ref Range    Hgb A1c MFr Bld 6.0 (H) 4.8 - 5.6 %    Comment: (NOTE) Pre diabetes:          5.7%-6.4%  Diabetes:              >6.4%  Glycemic control for   <7.0% adults with diabetes    Mean Plasma Glucose 125.5 mg/dL    Comment: Performed at [facility], [place] [address] [place], [place] [address]  Lipid panel     Status: Abnormal   Collection Time: 01/25/20  6:38 AM  Result Value Ref Range  Cholesterol 218 (H) 0 - 200 mg/dL   Triglycerides 156 (H) <150 mg/dL   HDL 39 (L) >40 mg/dL   Total CHOL/HDL Ratio 5.6 RATIO   VLDL 31 0 - 40 mg/dL   LDL Cholesterol 148 (H) 0 - 99 mg/dL    Comment:        Total Cholesterol/HDL:CHD Risk Coronary Heart Disease Risk Table                     Men   Women  1/2 Average Risk   3.4   3.3  Average Risk       5.0   4.4  2 X Average Risk   9.6   7.1  3 X Average Risk  23.4   11.0        Use the calculated Patient Ratio above and the CHD Risk Table to determine the patient's CHD Risk.        ATP III CLASSIFICATION (LDL):  <100     mg/dL   Optimal  100-129  mg/dL   Near or Above                    Optimal  130-159  mg/dL   Borderline  160-189  mg/dL   High  >190     mg/dL   Very High Performed at [facility] [address], [place], [place] [address]   TSH     Status: None   Collection Time: 01/25/20  6:38 AM  Result Value Ref Range   TSH 0.736 0.350 - 4.500 uIU/mL    Comment: Performed by a 3rd Generation assay with a functional sensitivity of <=0.01 uIU/mL. Performed at [facility], [place] [address], [place], [place] [address]     Blood Alcohol level:  Lab Results  Component Value Date   ETH <10 91/47/8295    Metabolic Disorder Labs: Lab Results  Component Value Date   HGBA1C 6.0 (H) 01/25/2020   MPG 125.5 01/25/2020   No results found for: PROLACTIN Lab Results  Component Value Date   CHOL 218 (H) 01/25/2020   TRIG 156 (H) 01/25/2020   HDL 39 (L) 01/25/2020   CHOLHDL 5.6 01/25/2020    VLDL 31 01/25/2020   LDLCALC 148 (H) 01/25/2020   LDLCALC 145 (H) 02/22/2019    Physical Findings: AIMS: Facial and Oral Movements Muscles of Facial Expression: None, normal Lips and Perioral Area: None, normal Jaw: None, normal Tongue: None, normal,Extremity Movements Upper (arms, wrists, hands, fingers): None, normal Lower (legs, knees, ankles, toes): None, normal, Trunk Movements Neck, shoulders, hips: None, normal, Overall Severity Severity of abnormal movements (highest score from questions above): None, normal Incapacitation due to abnormal movements: None, normal Patient's awareness of abnormal movements (rate only patient's report): No Awareness, Dental Status Current problems with teeth and/or dentures?: No Does patient usually wear dentures?: No  CIWA:  CIWA-Ar Total: 0 COWS:  COWS Total Score: 2  Musculoskeletal: Strength & Muscle Tone: within normal limits-no tremors, no diaphoresis, no restlessness or agitation Gait & Station: normal Patient leans: N/A  Psychiatric Specialty Exam: Physical Exam  Review of Systems denies headache, no visual disturbances, no chest pain, no shortness of breath, no vomiting  Blood pressure 108/76, pulse (!) 127, temperature 98.2 F (36.8 C), temperature source Oral, resp. rate 14, height '5\' 3"'  (1.6 m), weight 103.4 kg, SpO2 100 %.Body mass index is 40.39 kg/m.  General Appearance: Improved grooming  Eye Contact:  Good  Speech:  Normal Rate  Volume:  Normal  Mood:  Reports she is feeling better, denies feeling depressed at this time  Affect:  Vaguely constricted, becomes more reactive during session  Thought Process:  Linear and Descriptions of Associations: Intact  Orientation:  Full (Time, Place, and Person)  Thought Content:  No hallucinations, no delusions, not internally preoccupied  Suicidal Thoughts:  No denies suicidal or self-injurious ideations, denies homicidal or violent ideations, currently contracts for safety on unit   Homicidal Thoughts:  No  Memory:  Recent and remote grossly intact  Judgement:  Other:  Improving  Insight:  Fair  Psychomotor Activity:  Normal-no psychomotor agitation or restlessness  Concentration:  Concentration: Good and Attention Span: Good  Recall:  Good  Fund of Knowledge:  Good  Language:  Good  Akathisia:  Negative  Handed:  Right  AIMS (if indicated):     Assets:  Communication Skills Desire for Improvement Resilience  ADL's:  Intact  Cognition:  WNL  Sleep:  Number of Hours: 1.75   Assessment: 24 year old married female, employed, presented to ED on 7/5 following overdose on fluoxetine, Vyvanse, Ativan, oxycodone.  She also reported recent episode of self cutting. reported worsening depression, which have exacerbated following recent death of her grandmother.  Endorsed neurovegetative symptoms of depression.  No psychotic symptoms.  No prior psychiatric admissions.  No prior history of suicide attempts.  She does report a history of depression and anxiety.  Home medications reported as Prozac 40 mg daily, Vyvanse 30 mg daily, Xanax 0.5 mg twice daily as needed which she states she took only infrequently. She denies history of alcohol abuse but endorsed some increased alcohol consumption over recent weeks, has been drinking 2-3 times per week.  Today patient reports feeling better, describes improved mood, denies SI and presents future oriented/hopeful for discharge soon.  Affect remains vaguely constricted but is reactive.  Thus far tolerating Cymbalta trial well without side effects.  Not currently presenting with symptoms of withdrawal  Treatment Plan Summary: Daily contact with patient to assess and evaluate symptoms and progress in treatment, Medication management, Plan Inpatient treatment and Medication as below Encourage group and milieu participation Increase Cymbalta to 40 mg daily for depression/anxiety Continue Ativan as needed for alcohol withdrawal as  needed Continue Vistaril 25 mgrs Q 6 hours PRN for anxiety as needed Continue Protonix 40 mg daily for gastric protection Treatment team working on disposition planning options Jenne Campus, MD 01/25/2020, 3:14 PM

## 2020-01-26 DIAGNOSIS — F332 Major depressive disorder, recurrent severe without psychotic features: Secondary | ICD-10-CM | POA: Diagnosis present

## 2020-01-26 DIAGNOSIS — T50902A Poisoning by unspecified drugs, medicaments and biological substances, intentional self-harm, initial encounter: Secondary | ICD-10-CM

## 2020-01-26 HISTORY — DX: Poisoning by unspecified drugs, medicaments and biological substances, intentional self-harm, initial encounter: T50.902A

## 2020-01-26 MED ORDER — DULOXETINE HCL 40 MG PO CPEP: 40.0000 mg | ORAL_CAPSULE | Freq: Every day | ORAL | 1 refills | Status: DC

## 2020-01-26 MED ORDER — THIAMINE HCL 100 MG PO TABS: 100.0000 mg | ORAL_TABLET | Freq: Every day | ORAL | 0 refills | Status: AC

## 2020-01-26 MED ORDER — ADULT MULTIVITAMIN W/MINERALS CH: 1.0000 | ORAL_TABLET | Freq: Every day | ORAL | 0 refills | Status: AC

## 2020-01-26 MED ORDER — HYDROXYZINE HCL 25 MG PO TABS: 25.0000 mg | ORAL_TABLET | Freq: Three times a day (TID) | ORAL | 0 refills | Status: DC | PRN

## 2020-01-26 NOTE — [facility] Suicide Risk Assessment (Signed)
[facility] INPATIENT:  Family/Significant Other Suicide Prevention Education  Suicide Prevention Education:  Education Completed; Husband Sonya Santiago [phone],  (name of family member/significant other) has been identified by the patient as the family member/significant other with whom the patient will be residing, and identified as the person(s) who will aid the patient in the event of a mental health crisis (suicidal ideations/suicide attempt).  With written consent from the patient, the family member/significant other has been provided the following suicide prevention education, prior to the and/or following the discharge of the patient.  Husband called back and SPE was provided.  He stated he has done a sweep of the house and gathered all medicines and alcohol so that patient will not have access.  He plans to administer her medication daily, was given positive strokes for this.  The suicide prevention education provided includes the following:  Suicide risk factors  Suicide prevention and interventions  National Suicide Hotline telephone number  [facility] assessment telephone number  [facility] Emergency Assistance 911  [facility] and/or Residential Mobile Crisis Unit telephone number  Request made of family/significant other to:  Remove weapons (e.g., guns, rifles, knives), all items previously/currently identified as safety concern.    Remove drugs/medications (over-the-counter, prescriptions, illicit drugs), all items previously/currently identified as a safety concern.  The family member/significant other verbalizes understanding of the suicide prevention education information provided.  The family member/significant other agrees to remove the items of safety concern listed above.  Carloyn Jaeger Grossman-Orr 01/26/2020, 9:49 AM

## 2020-01-26 NOTE — [facility] Suicide Risk Assessment (Signed)
[facility] INPATIENT:  Family/Significant Other Suicide Prevention Education  Suicide Prevention Education:  Contact Attempts: Lajoy Vanamburg, patient's spouse [phone] and [phone], (name of family member/significant other) has been identified by the patient as the family member/significant other with whom the patient will be residing, and identified as the person(s) who will aid the patient in the event of a mental health crisis.  With written consent from the patient, two attempts were made to provide suicide prevention education, prior to and/or following the patient's discharge.  We were unsuccessful in providing suicide prevention education.  A suicide education pamphlet was given to the patient to share with family/significant other.  Date and time of first attempt:   01/24/2020  /  11:50am  On 1st number given Date and time of second attempt:  01/25/2020  /  3:55pm  On 1st number given Date and time of third attempt:  01/26/2020  /  Now  On 2nd number given, VM left  Lynnell Chad 01/26/2020, 9:38 AM

## 2020-01-26 NOTE — Discharge Summary (Addendum)
Physician Discharge Summary Note  Patient:  Sonya Santiago is an 24 y.o., female admitted for intentional OD- medications-Ativan, Vyvanse, Prozac and Oxycodone. MRN:  [id number] DOB:  11-03-1995 Patient phone:  [phone] (home)  Patient address:   [address] [place] [address],  Total Time spent with patient: 30 minutes  Date of Admission:  01/24/2020 Date of Discharge: 01/26/20  Reason for Admission:  Suicide attempt-OD  Principal Problem: OD (overdose of drug), intentional self-harm, initial encounter [facility]) Discharge Diagnoses: Principal Problem:   OD (overdose of drug), intentional self-harm, initial encounter (HCC) Active Problems:   MDD (major depressive disorder)   Major depressive disorder, recurrent episode, severe (HCC)   Past Psychiatric History:Depression, Anxiety  Past Medical History:  Past Medical History:  Diagnosis Date  . Allergy   . Anxiety   . GERD (gastroesophageal reflux disease)   . Pre-diabetes     Past Surgical History:  Procedure Laterality Date  . TONSILLECTOMY AND ADENOIDECTOMY    . WISDOM TOOTH EXTRACTION     Family History:  Family History  Problem Relation Age of Onset  . Cancer Mother        Breast, Thyroid  . Asthma Brother   . Cancer Maternal Grandmother   . Diabetes Maternal Grandfather   . Heart disease Maternal Grandfather   . Diabetes Paternal Grandmother   . Cirrhosis Paternal Grandfather    Family Psychiatric  History: denies Social History:  Social History   Substance and Sexual Activity  Alcohol Use Yes  . Alcohol/week: 0.0 standard drinks   Comment: last drink was on Monday (2 shots of tequila), drinks 2-3 days a week and can be up to an entire bottle of tequila     Social History   Substance and Sexual Activity  Drug Use Yes  . Types: Other-see comments   Comment: weed gummies/pen, "one gummy in past 2 weeks," "delta 8"    Social History   Socioeconomic History  . Marital status: Married    Spouse name:  Not on file  . Number of children: Not on file  . Years of education: Not on file  . Highest education level: Not on file  Occupational History  . Not on file  Tobacco Use  . Smoking status: Former Smoker    Packs/day: 0.10    Start date: 08/13/2013    Quit date: 07/12/2014    Years since quitting: 5.5  . Smokeless tobacco: Never Used  Vaping Use  . Vaping Use: Some days  . Devices: vaping 2-3 times a week  Substance and Sexual Activity  . Alcohol use: Yes    Alcohol/week: 0.0 standard drinks    Comment: last drink was on Monday (2 shots of tequila), drinks 2-3 days a week and can be up to an entire bottle of tequila  . Drug use: Yes    Types: Other-see comments    Comment: weed gummies/pen, "one gummy in past 2 weeks," "delta 8"  . Sexual activity: Yes    Birth control/protection: None  Other Topics Concern  . Not on file  Social History Narrative  . Not on file   Social Determinants of Health   Financial Resource Strain:   . Difficulty of Paying Living Expenses:   Food Insecurity:   . Worried About Programme researcher, broadcasting/film/video in the Last Year:   . Barista in the Last Year:   Transportation Needs:   . Freight forwarder (Medical):   Marland Kitchen Lack of Transportation (Non-Medical):  Physical Activity:   . Days of Exercise per Week:   . Minutes of Exercise per Session:   Stress:   . Feeling of Stress :   Social Connections:   . Frequency of Communication with Friends and Family:   . Frequency of Social Gatherings with Friends and Family:   . Attends Religious Services:   . Active Member of Clubs or Organizations:   . Attends Banker Meetings:   Marland Kitchen Marital Status:     Hospital Course:  Patient is married, lives with her husband who is supportive.  Patient was originally sent to [facility] ER after the OD by EMS. She stated that her intent was to die and she was actually planning to use her father gun to kill herself.  She has a long hx of Depression  and anxiety and sees her PMD who prescribes medications for her.  She admits that this OD is her first time and states that she was going through tremendous stress after the funeral for her grandmother.  She also listed her poor relationship with her mother.  She is remorseful today and contracted for safety.  She denies SI/HI/AVH and will be going back to her husband.  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  She is alert and oriented x4, insight and judgement is good.  She is discharged.  Physical Findings: AIMS: Facial and Oral Movements Muscles of Facial Expression: None, normal Lips and Perioral Area: None, normal Jaw: None, normal Tongue: None, normal,Extremity Movements Upper (arms, wrists, hands, fingers): None, normal Lower (legs, knees, ankles, toes): None, normal, Trunk Movements Neck, shoulders, hips: None, normal, Overall Severity Severity of abnormal movements (highest score from questions above): None, normal Incapacitation due to abnormal movements: None, normal Patient's awareness of abnormal movements (rate only patient's report): No Awareness, Dental Status Current problems with teeth and/or dentures?: No Does patient usually wear dentures?: No  CIWA:  CIWA-Ar Total: 1 COWS:  COWS Total Score: 2  Musculoskeletal: Strength & Muscle Tone: within normal limits Gait & Station: normal Patient leans: N/A  Psychiatric Specialty Exam: Physical Exam  Review of Systems  All other systems reviewed and are negative.   Blood pressure 134/82, pulse 77, temperature 98.1 F (36.7 C), temperature source Oral, resp. rate 14, height 5\' 3"  (1.6 m), weight 103.4 kg, SpO2 100 %.Body mass index is 40.39 kg/m.  General Appearance: Fairly Groomed  Eye Contact:  Good  Speech:  Clear and Coherent  Volume:  Normal  Mood:  Euthymic  Affect:  bright on approach  Thought Process:  Coherent and Goal Directed  Orientation:  Full (Time, Place,  and Person)  Thought Content:  Logical  Suicidal Thoughts:  No  Homicidal Thoughts:  No  Memory:  intact  Judgement:  Good  Insight:  Good  Psychomotor Activity:  Normal  Concentration:  Concentration: Good and Attention Span: Good  Recall:  Good  Fund of Knowledge:  Good  Language:  Good  Akathisia:  NA  Handed:  Right  AIMS (if indicated):     Assets:  Communication Skills Desire for Improvement Housing Intimacy Physical Health Transportation Others:  goal to finish Nursing school.  ADL's:  Intact  Cognition:  WNL  Sleep:  Number of Hours: 6.75        Has this patient used any form of tobacco in the last 30 days? (Cigarettes, Smokeless Tobacco, Cigars, and/or Pipes) Yes, No  Blood  Alcohol level: 0 Lab Results  Component Value Date   ETH <10 01/21/2020    Metabolic Disorder Labs:  Lab Results  Component Value Date   HGBA1C 6.0 (H) 01/25/2020   MPG 125.5 01/25/2020   No results found for: PROLACTIN Lab Results  Component Value Date   CHOL 218 (H) 01/25/2020   TRIG 156 (H) 01/25/2020   HDL 39 (L) 01/25/2020   CHOLHDL 5.6 01/25/2020   VLDL 31 01/25/2020   LDLCALC 148 (H) 01/25/2020   LDLCALC 145 (H) 02/22/2019    See Psychiatric Specialty Exam and Suicide Risk Assessment completed by Attending Physician prior to discharge.  Discharge destination:  Home  Is patient on multiple antipsychotic therapies at discharge:  No   Has Patient had three or more failed trials of antipsychotic monotherapy by history:  No  Recommended Plan for Multiple Antipsychotic Therapies: NA   Allergies as of 01/26/2020   No Known Allergies     Medication List    STOP taking these medications   ALPRAZolam 0.5 MG tablet Commonly known as: XANAX   FLUoxetine 40 MG capsule Commonly known as: PROZAC   lisdexamfetamine 30 MG capsule Commonly known as: Vyvanse     TAKE these medications     Indication  DULoxetine HCl 40 MG Cpep Take 40 mg by mouth daily. Start taking  on: January 27, 2020  Indication: Generalized Anxiety Disorder, Major Depressive Disorder   hydrOXYzine 25 MG tablet Commonly known as: ATARAX/VISTARIL Take 1 tablet (25 mg total) by mouth every 8 (eight) hours as needed for anxiety (anxiety/agitation or CIWA < or = 10).  Indication: Feeling Anxious   multivitamin with minerals Tabs tablet Take 1 tablet by mouth daily. Start taking on: January 27, 2020  Indication: Nutritional Support   pantoprazole 40 MG tablet Commonly known as: PROTONIX Take 1 tablet (40 mg total) by mouth daily.  Indication: Gastroesophageal Reflux Disease   thiamine 100 MG tablet Take 1 tablet (100 mg total) by mouth daily. Start taking on: January 27, 2020  Indication: Deficiency of Vitamin B1, alcohol use       Follow-up Information    [facility]. Go on 02/12/2020.   Specialty: Family Medicine Why: You have a hospital discharge appointment for medication management on 02/12/20 at 11:00 am.  This appointment will be held in person. Contact information: [address] [place] [address] [phone]       [facility] Employee Assistance Program. Schedule an appointment as soon as possible for a visit.   Why: Please contact this provider for therapy services. Contact information: [address], [address] [place], [place] [address]  P: [phone] F: [phone]       [facility] Follow up.   Specialty: Behavioral Health Why: Your doctors feel it would be best for your medicine to be managed by a psychiatrist.  This facility could do this for you -- feel free to call them to get started with a specialist. Contact information: [address] [place] [address] [phone]              Follow-up recommendations:  Activity:  as tolerated  Comments:  Will seek Psychiatric and counseling care after discharge.  Signed: Earney Navy, NP 01/26/2020, 11:17 AM   Patient seen, Suicide Assessment Completed.  Disposition Plan Reviewed

## 2020-01-26 NOTE — Progress Notes (Signed)
Pt denies SI/HI. Pt received written and verbal discharge instructions. Pt verbalized understanding of discharge instructions. Pt agreed to f/u appt and med regimen. Pt received an AVS, SRA, transitional record and a prescription for multi-vitamins. All other scheduled medications were sent electronically to the pt's pharmacy on file. The pt received belongings from room and locker and was safely discharged to the lobby and picked up by her husband.

## 2020-01-26 NOTE — BHH Group Notes (Signed)
Adult Psychoeducational Group Note  Date:  01/26/2020 Time:  10:38 AM  Group Topic/Focus:  Goals Group:   The focus of this group is to help patients establish daily goals to achieve during treatment and discuss how the patient can incorporate goal setting into their daily lives to aide in recovery.  Participation Level:  Active  Participation Quality:  Appropriate  Affect:  Flat  Cognitive:  Oriented  Insight: Good  Engagement in Group:  Engaged  Modes of Intervention:  Discussion, Education and Socialization  Additional Comments:  Pt states that she wants to call her instructors and find out what she can do to catch up in her school work.  Dione Housekeeper 01/26/2020, 10:38 AM

## 2020-01-26 NOTE — BHH Suicide Risk Assessment (Signed)
[facility] Discharge Suicide Risk Assessment   Principal Problem: MDD Discharge Diagnoses: Active Problems:   MDD (major depressive disorder)   Total Time spent with patient: 30 minutes  Musculoskeletal: Strength & Muscle Tone: within normal limits Gait & Station: normal Patient leans: N/A  Psychiatric Specialty Exam: Review of Systems no headache, no chest pain, no shortness of breath, no cough, no fever or chills   Blood pressure 134/82, pulse 77, temperature 98.1 F (36.7 C), temperature source Oral, resp. rate 14, height 5\' 3"  (1.6 m), weight 103.4 kg, SpO2 100 %.Body mass index is 40.39 kg/m.  General Appearance: Well Groomed  Eye Contact::  Good  Speech:  Normal Rate409  Volume:  Normal  Mood:  reports improved mood , denies feeing depressed at this time  Affect:  fuller in range / brighter  Thought Process:  Linear and Descriptions of Associations: Intact  Orientation:  Full (Time, Place, and Person)  Thought Content:  no hallucinations, no delusions   Suicidal Thoughts:  No denies any suicidal or self injurious ideations  Homicidal Thoughts:  No  Memory:  recent and remote grossly intact   Judgement:  Improved   Insight:  Fair/ improving  Psychomotor Activity:  Normal  Concentration:  Good  Recall:  Good  Fund of Knowledge:Good  Language: Good  Akathisia:  Negative  Handed:  Right  AIMS (if indicated):     Assets:  Communication Skills Desire for Improvement Resilience Social Support  Sleep:  Number of Hours: 6.75  Cognition: WNL  ADL's:  Intact   Mental Status Per Nursing Assessment::   On Admission:  Suicidal ideation indicated by patient  Demographic Factors:  23, married, no children, employed   Loss Factors: Grandmother passed away recently  Historical Factors: No prior psychiatric admissions , no prior suicide attempts   Risk Reduction Factors:   Sense of responsibility to family, Employed, Living with another person, especially a relative,  Positive social support and Positive coping skills or problem solving skills  Continued Clinical Symptoms:  Today patient presents alert, attentive, calm , pleasant on approach, mood described as improved and denies feeling depressed, affect appropriate and fuller in range, no thought disorder, no suicidal or self injurious ideations, no hallucinations, no delusions, future oriented . Thus far tolerating Cymbalta trial well . Side effects have been reviewed . With her express consent I spoke with her husband via phone, who corroborates patient is improved , back to baseline, and he is hoping she is discharged today. Behavior on unit in good control. No WDL symptoms.  Cognitive Features That Contribute To Risk:  No gross cognitive deficits noted upon discharge. Is alert , attentive, and oriented x 3   Suicide Risk:  Mild:  Suicidal ideation of limited frequency, intensity, duration, and specificity.  There are no identifiable plans, no associated intent, mild dysphoria and related symptoms, good self-control (both objective and subjective assessment), few other risk factors, and identifiable protective factors, including available and accessible social support.   Follow-up Information    [phone] [facility]. Go on 02/12/2020.   Specialty: Family Medicine Why: You have a hospital discharge appointment for medication management on 02/12/20 at 11:00 am.  This appointment will be held in person. Contact information: [address] [place] [phone]       [facility] Employee Assistance Program. Schedule an appointment as soon as possible for a visit.   Why: Please contact this provider for therapy services. Contact information: [address] [facility]  [address] [place], [place] [address]  P: [phone] F: [phone]              Plan Of Care/Follow-up recommendations:  Activity:  as tolerated  Diet:  regular Tests:   NA Other:  See below  Patient is expressing readiness for discharge , and is leaving unit in good spirits, no grounds for involuntary commitment at this time. She plans to return home, plans to follow up as above , and plans to follow up with PCP- she is aware of hgba1c in prediabetic range .   Craige Cotta, MD 01/26/2020, 7:35 AM

## 2020-01-26 NOTE — Progress Notes (Addendum)
  [facility] Adult Case Management Discharge Plan :  Will you be returning to the same living situation after discharge:  Yes,  with spouse At discharge, do you have transportation home?: Yes,  family Do you have the ability to pay for your medications: Yes,  insurance and income  Release of information consent forms completed and emailed to Medical Records, then turned in to Medical Records by CSW.   Patient to Follow up at:  Follow-up Information    [facility]. Go on 02/12/2020.   Specialty: Family Medicine Why: You have a hospital discharge appointment for medication management on 02/12/20 at 11:00 am.  This appointment will be held in person. Contact information: [address] [place] [address] [phone]       [facility] Employee Assistance Program. Schedule an appointment as soon as possible for a visit.   Why: Please contact this provider for therapy services. Contact information: [address] [place], [place] [address]  P: [phone] F: [phone]             [facility] Follow up.   Specialty: Behavioral Health Why: Your doctors feel it would be best for your medicine to be managed by a psychiatrist.  This facility could do this for you -- feel free to call them to get started with a specialist. Contact information: [address] [place] [address] [phone]   Next level of care provider has access to [facility] Link:yes  Safety Planning and Suicide Prevention discussed: No.   Attempted 3 times with husband  Addendum at 9:51am - husband called back and SPE was provided    Has patient been referred to the Quitline?: N/A patient is not a smoker  Patient has been referred for addiction treatment: N/A  Lynnell Chad, LCSW 01/26/2020, 9:44 AM

## 2020-01-27 ENCOUNTER — Encounter: Payer: Self-pay | Admitting: Physician Assistant

## 2020-02-12 ENCOUNTER — Inpatient Hospital Stay: Payer: 59 | Admitting: Physician Assistant

## 2020-04-17 ENCOUNTER — Other Ambulatory Visit: Payer: Self-pay

## 2020-04-17 ENCOUNTER — Ambulatory Visit: Payer: 59 | Admitting: Physician Assistant

## 2020-04-17 ENCOUNTER — Encounter: Payer: Self-pay | Admitting: Physician Assistant

## 2020-04-17 VITALS — BP 124/72 | HR 106 | Temp 98.4°F | Resp 17 | Wt 248.6 lb

## 2020-04-17 DIAGNOSIS — R808 Other proteinuria: Secondary | ICD-10-CM | POA: Diagnosis not present

## 2020-04-17 DIAGNOSIS — R350 Frequency of micturition: Secondary | ICD-10-CM

## 2020-04-17 LAB — POCT URINALYSIS DIPSTICK
Bilirubin, UA: NEGATIVE
Blood, UA: NEGATIVE
Glucose, UA: NEGATIVE
Ketones, UA: NEGATIVE
Leukocytes, UA: NEGATIVE
Nitrite, UA: NEGATIVE
Protein, UA: POSITIVE — AB
Spec Grav, UA: >=1.03 — AB (ref 1.010–1.025)
Urobilinogen, UA: 0.2 E.U./dL
pH, UA: 6 (ref 5.0–8.0)

## 2020-04-17 LAB — CBC WITH DIFFERENTIAL/PLATELET
Basophils Absolute: 0.1 10*3/uL (ref 0.0–0.1)
Basophils Relative: 0.7 % (ref 0.0–3.0)
Eosinophils Absolute: 0.1 10*3/uL (ref 0.0–0.7)
Eosinophils Relative: 1.4 % (ref 0.0–5.0)
HCT: 33.1 % — ABNORMAL LOW (ref 36.0–46.0)
Hemoglobin: 10.4 g/dL — ABNORMAL LOW (ref 12.0–15.0)
Lymphocytes Relative: 27.1 % (ref 12.0–46.0)
Lymphs Abs: 2.5 10*3/uL (ref 0.7–4.0)
MCHC: 31.5 g/dL (ref 30.0–36.0)
MCV: 74.3 fl — ABNORMAL LOW (ref 78.0–100.0)
Monocytes Absolute: 0.6 10*3/uL (ref 0.1–1.0)
Monocytes Relative: 6.8 % (ref 3.0–12.0)
Neutro Abs: 5.9 10*3/uL (ref 1.4–7.7)
Neutrophils Relative %: 64 % (ref 43.0–77.0)
Platelets: 372 10*3/uL (ref 150.0–400.0)
RBC: 4.46 Mil/uL (ref 3.87–5.11)
RDW: 15 % (ref 11.5–15.5)
WBC: 9.3 10*3/uL (ref 4.0–10.5)

## 2020-04-17 LAB — COMPREHENSIVE METABOLIC PANEL
ALT: 19 U/L (ref 0–35)
AST: 13 U/L (ref 0–37)
Albumin: 4.2 g/dL (ref 3.5–5.2)
Alkaline Phosphatase: 73 U/L (ref 39–117)
BUN: 12 mg/dL (ref 6–23)
CO2: 23 mEq/L (ref 19–32)
Calcium: 9.1 mg/dL (ref 8.4–10.5)
Chloride: 102 mEq/L (ref 96–112)
Creatinine, Ser: 0.64 mg/dL (ref 0.40–1.20)
GFR: 112 mL/min (ref >60.00)
Glucose, Bld: 109 mg/dL — ABNORMAL HIGH (ref 70–99)
Potassium: 3.7 mEq/L (ref 3.5–5.1)
Sodium: 136 mEq/L (ref 135–145)
Total Bilirubin: 0.2 mg/dL (ref 0.2–1.2)
Total Protein: 7 g/dL (ref 6.0–8.3)

## 2020-04-17 LAB — URINALYSIS, MICROSCOPIC ONLY: RBC / HPF: NONE SEEN (ref 0–?)

## 2020-04-17 LAB — HCG, QUANTITATIVE, PREGNANCY: Quantitative HCG: <0.6 m[IU]/mL

## 2020-04-17 NOTE — Patient Instructions (Signed)
Please keep well-hydrated. Make sure to keep from skipping meals.   Please go to the lab today for blood work.  I will call you with your results. We will alter treatment regimen(s) if indicated by your results.   Let us know of any new or worsening symptoms ASAP.

## 2020-04-17 NOTE — Progress Notes (Signed)
Patient presents to clinic today c/o dark yellow urine over the past week or so, associated with urinary frequency.  Denies dysuria, urinary urgency, low back pain, abdominal pain, suprapubic pressure, nausea, vomiting, fever, chills or flank pain.  Denies vaginal pain or discomfort.  Has history of irregular periods.  Notes last period was at the end of July.  Has had issue conceiving so did not feel she was pregnant, however she did take several urine pregnancy test over the past couple months.  All of these were negative.  Patient denies any polyuria, polydipsia or polyphagia.  Denies any numbness or tingling of hands or feet.  Has history of UTI but states this feels different to her.  Past Medical History:  Diagnosis Date  . Allergy   . Anxiety   . GERD (gastroesophageal reflux disease)   . Pre-diabetes     Current Outpatient Medications on File Prior to Visit  Medication Sig Dispense Refill  . DULoxetine 40 MG CPEP Take 40 mg by mouth daily. 30 capsule 1  . hydrOXYzine (ATARAX/VISTARIL) 25 MG tablet Take 1 tablet (25 mg total) by mouth every 8 (eight) hours as needed for anxiety (anxiety/agitation or CIWA < or = 10). 30 tablet 0  . pantoprazole (PROTONIX) 40 MG tablet Take 1 tablet (40 mg total) by mouth daily. 90 tablet 1   No current facility-administered medications on file prior to visit.    No Known Allergies  Family History  Problem Relation Age of Onset  . Cancer Mother        Breast, Thyroid  . Asthma Brother   . Cancer Maternal Grandmother   . Diabetes Maternal Grandfather   . Heart disease Maternal Grandfather   . Diabetes Paternal Grandmother   . Cirrhosis Paternal Grandfather     Social History   Socioeconomic History  . Marital status: Married    Spouse name: Not on file  . Number of children: Not on file  . Years of education: Not on file  . Highest education level: Not on file  Occupational History  . Not on file  Tobacco Use  . Smoking status:  Former Smoker    Packs/day: 0.10    Start date: 08/13/2013    Quit date: 07/12/2014    Years since quitting: 5.7  . Smokeless tobacco: Never Used  Vaping Use  . Vaping Use: Some days  . Devices: vaping 2-3 times a week  Substance and Sexual Activity  . Alcohol use: Yes    Alcohol/week: 0.0 standard drinks    Comment: last drink was on Monday (2 shots of tequila), drinks 2-3 days a week and can be up to an entire bottle of tequila  . Drug use: Yes    Types: Other-see comments    Comment: weed gummies/pen, "one gummy in past 2 weeks," "delta 8"  . Sexual activity: Yes    Birth control/protection: None  Other Topics Concern  . Not on file  Social History Narrative  . Not on file   Social Determinants of Health   Financial Resource Strain:   . Difficulty of Paying Living Expenses: Not on file  Food Insecurity:   . Worried About Charity fundraiser in the Last Year: Not on file  . Ran Out of Food in the Last Year: Not on file  Transportation Needs:   . Lack of Transportation (Medical): Not on file  . Lack of Transportation (Non-Medical): Not on file  Physical Activity:   . Days of  Exercise per Week: Not on file  . Minutes of Exercise per Session: Not on file  Stress:   . Feeling of Stress : Not on file  Social Connections:   . Frequency of Communication with Friends and Family: Not on file  . Frequency of Social Gatherings with Friends and Family: Not on file  . Attends Religious Services: Not on file  . Active Member of Clubs or Organizations: Not on file  . Attends Archivist Meetings: Not on file  . Marital Status: Not on file    Review of Systems - See HPI.  All other ROS are negative.  BP 124/72   Pulse (!) 106   Temp 98.4 F (36.9 C) (Temporal)   Resp 17   Wt 248 lb 9.6 oz (112.8 kg)   SpO2 98%   BMI 44.04 kg/m   Physical Exam Vitals reviewed.  Constitutional:      Appearance: Normal appearance.  HENT:     Head: Normocephalic and atraumatic.    Cardiovascular:     Rate and Rhythm: Normal rate and regular rhythm.     Pulses: Normal pulses.     Heart sounds: Normal heart sounds.  Pulmonary:     Effort: Pulmonary effort is normal.     Breath sounds: Normal breath sounds.  Abdominal:     General: Bowel sounds are normal. There is no distension.     Palpations: Abdomen is soft. There is no mass.     Tenderness: There is no abdominal tenderness. There is no guarding.     Hernia: No hernia is present.  Musculoskeletal:     Cervical back: Neck supple.  Neurological:     General: No focal deficit present.     Mental Status: She is alert and oriented to person, place, and time.  Psychiatric:        Mood and Affect: Mood normal.     Recent Results (from the past 2160 hour(s))  Comprehensive metabolic panel     Status: Abnormal   Collection Time: 01/21/20  9:47 PM  Result Value Ref Range   Sodium 139 135 - 145 mmol/L   Potassium 3.4 (L) 3.5 - 5.1 mmol/L   Chloride 103 98 - 111 mmol/L   CO2 24 22 - 32 mmol/L   Glucose, Bld 100 (H) 70 - 99 mg/dL    Comment: Glucose reference range applies only to samples taken after fasting for at least 8 hours.   BUN 11 6 - 20 mg/dL   Creatinine, Ser 0.66 0.44 - 1.00 mg/dL   Calcium 9.4 8.9 - 10.3 mg/dL   Total Protein 8.2 (H) 6.5 - 8.1 g/dL   Albumin 4.4 3.5 - 5.0 g/dL   AST 20 15 - 41 U/L   ALT 29 0 - 44 U/L   Alkaline Phosphatase 83 38 - 126 U/L   Total Bilirubin 0.6 0.3 - 1.2 mg/dL   GFR calc non Af Amer >60 >60 mL/min   GFR calc Af Amer >60 >60 mL/min   Anion gap 12 5 - 15    Comment: Performed at [facility], [address], [place], [place] [address]  Ethanol     Status: None   Collection Time: 01/21/20  9:47 PM  Result Value Ref Range   Alcohol, Ethyl (B) <10 <10 mg/dL    Comment: (NOTE) Lowest detectable limit for serum alcohol is 10 mg/dL.  For medical purposes only. Performed at [facility], [address], [place], [place] [address]  Salicylate level     Status:  Abnormal   Collection Time: 01/21/20  9:47 PM  Result Value Ref Range   Salicylate Lvl <5.3 (L) 7.0 - 30.0 mg/dL    Comment: Performed at [facility], [address], [place], [place] [address]  Acetaminophen level     Status: Abnormal   Collection Time: 01/21/20  9:47 PM  Result Value Ref Range   Acetaminophen (Tylenol), Serum <10 (L) 10 - 30 ug/mL    Comment: (NOTE) Therapeutic concentrations vary significantly. A range of 10-30 ug/mL  may be an effective concentration for many patients. However, some  are best treated at concentrations outside of this range. Acetaminophen concentrations >150 ug/mL at 4 hours after ingestion  and >50 ug/mL at 12 hours after ingestion are often associated with  toxic reactions.  Performed at [facility], [address] [place], [place] [address]   cbc     Status: Abnormal   Collection Time: 01/21/20  9:47 PM  Result Value Ref Range   WBC 13.1 (H) 4.0 - 10.5 K/uL   RBC 4.82 3.87 - 5.11 MIL/uL   Hemoglobin 11.4 (L) 12.0 - 15.0 g/dL   HCT 37.8 36 - 46 %   MCV 78.4 (L) 80.0 - 100.0 fL   MCH 23.7 (L) 26.0 - 34.0 pg   MCHC 30.2 30.0 - 36.0 g/dL   RDW 15.0 11.5 - 15.5 %   Platelets 507 (H) 150 - 400 K/uL   nRBC 0.0 0.0 - 0.2 %    Comment: Performed at [facility], [address] [place], [place] [address]  CBG monitoring, ED     Status: Abnormal   Collection Time: 01/21/20 10:18 PM  Result Value Ref Range   Glucose-Capillary 53 (L) 70 - 99 mg/dL    Comment: Glucose reference range applies only to samples taken after fasting for at least 8 hours.  CBG monitoring, ED     Status: Abnormal   Collection Time: 01/21/20 10:49 PM  Result Value Ref Range   Glucose-Capillary 155 (H) 70 - 99 mg/dL    Comment: Glucose reference range applies only to samples taken after fasting for at least 8 hours.  Rapid urine drug screen (hospital performed)     Status: Abnormal   Collection Time: 01/21/20 11:27 PM  Result Value Ref Range   Opiates NONE DETECTED NONE  DETECTED   Cocaine NONE DETECTED NONE DETECTED   Benzodiazepines POSITIVE (A) NONE DETECTED   Amphetamines POSITIVE (A) NONE DETECTED   Tetrahydrocannabinol POSITIVE (A) NONE DETECTED   Barbiturates NONE DETECTED NONE DETECTED    Comment: (NOTE) DRUG SCREEN FOR MEDICAL PURPOSES ONLY.  IF CONFIRMATION IS NEEDED FOR ANY PURPOSE, NOTIFY LAB WITHIN 5 DAYS.  LOWEST DETECTABLE LIMITS FOR URINE DRUG SCREEN Drug Class                     Cutoff (ng/mL) Amphetamine and metabolites    1000 Barbiturate and metabolites    200 Benzodiazepine                 037 Tricyclics and metabolites     300 Opiates and metabolites        300 Cocaine and metabolites        300 THC                            50 Performed at [facility], [address], [place], [place] [address]  Pregnancy, urine     Status: None   Collection Time: 01/21/20 11:27 PM  Result Value Ref Range   Preg Test, Ur NEGATIVE NEGATIVE    Comment:        THE SENSITIVITY OF THIS METHODOLOGY IS >20 mIU/mL. Performed at [facility], [address], [place], [place] [address]   Acetaminophen level     Status: Abnormal   Collection Time: 01/22/20 12:05 AM  Result Value Ref Range   Acetaminophen (Tylenol), Serum <10 (L) 10 - 30 ug/mL    Comment: (NOTE) Therapeutic concentrations vary significantly. A range of 10-30 ug/mL  may be an effective concentration for many patients. However, some  are best treated at concentrations outside of this range. Acetaminophen concentrations >150 ug/mL at 4 hours after ingestion  and >50 ug/mL at 12 hours after ingestion are often associated with  toxic reactions.  Performed at [facility], [address] [place], [place] [address]   Acetaminophen level     Status: Abnormal   Collection Time: 01/22/20  2:55 AM  Result Value Ref Range   Acetaminophen (Tylenol), Serum <10 (L) 10 - 30 ug/mL    Comment: (NOTE) Therapeutic concentrations vary significantly. A range of 10-30 ug/mL  may be an  effective concentration for many patients. However, some  are best treated at concentrations outside of this range. Acetaminophen concentrations >150 ug/mL at 4 hours after ingestion  and >50 ug/mL at 12 hours after ingestion are often associated with  toxic reactions.  Performed at [facility], [address], [place], [place] [address]   SARS Coronavirus 2 by RT PCR (hospital order, performed in [facility] hospital lab) Nasopharyngeal Nasopharyngeal Swab     Status: None   Collection Time: 01/22/20 11:28 AM   Specimen: Nasopharyngeal Swab  Result Value Ref Range   SARS Coronavirus 2 NEGATIVE NEGATIVE    Comment: (NOTE) SARS-CoV-2 target nucleic acids are NOT DETECTED.  The SARS-CoV-2 RNA is generally detectable in upper and lower respiratory specimens during the acute phase of infection. The lowest concentration of SARS-CoV-2 viral copies this assay can detect is 250 copies / mL. A negative result does not preclude SARS-CoV-2 infection and should not be used as the sole basis for treatment or other patient management decisions.  A negative result may occur with improper specimen collection / handling, submission of specimen other than nasopharyngeal swab, presence of viral mutation(s) within the areas targeted by this assay, and inadequate number of viral copies (<250 copies / mL). A negative result must be combined with clinical observations, patient history, and epidemiological information.  Fact Sheet for Patients:   StrictlyIdeas.no  Fact Sheet for Healthcare Providers: BankingDealers.co.za  This test is not yet approved or  cleared by the [place] FDA and has been authorized for detection and/or diagnosis of SARS-CoV-2 by FDA under an Emergency Use Authorization (EUA).  This EUA will remain in effect (meaning this test can be used) for the duration of the COVID-19 declaration under Section 564(b)(1) of the Act, 21  U.S.C. section 360bbb-3(b)(1), unless the authorization is terminated or revoked sooner.  Performed at [facility], [address] [place], [place] [address]   Hemoglobin A1c     Status: Abnormal   Collection Time: 01/25/20  6:38 AM  Result Value Ref Range   Hgb A1c MFr Bld 6.0 (H) 4.8 - 5.6 %    Comment: (NOTE) Pre diabetes:          5.7%-6.4%  Diabetes:              >  6.4%  Glycemic control for   <7.0% adults with diabetes    Mean Plasma Glucose 125.5 mg/dL    Comment: Performed at [facility] [address], [place], [place] [address]  Lipid panel     Status: Abnormal   Collection Time: 01/25/20  6:38 AM  Result Value Ref Range   Cholesterol 218 (H) 0 - 200 mg/dL   Triglycerides 156 (H) <150 mg/dL   HDL 39 (L) >40 mg/dL   Total CHOL/HDL Ratio 5.6 RATIO   VLDL 31 0 - 40 mg/dL   LDL Cholesterol 148 (H) 0 - 99 mg/dL    Comment:        Total Cholesterol/HDL:CHD Risk Coronary Heart Disease Risk Table                     Men   Women  1/2 Average Risk   3.4   3.3  Average Risk       5.0   4.4  2 X Average Risk   9.6   7.1  3 X Average Risk  23.4   11.0        Use the calculated Patient Ratio above and the CHD Risk Table to determine the patient's CHD Risk.        ATP III CLASSIFICATION (LDL):  <100     mg/dL   Optimal  100-129  mg/dL   Near or Above                    Optimal  130-159  mg/dL   Borderline  160-189  mg/dL   High  >190     mg/dL   Very High Performed at [facility] [address] [place], [place] [address]   TSH     Status: None   Collection Time: 01/25/20  6:38 AM  Result Value Ref Range   TSH 0.736 0.350 - 4.500 uIU/mL    Comment: Performed by a 3rd Generation assay with a functional sensitivity of <=0.01 uIU/mL. Performed at [facility], [place] [address] [place], [place] [address]   POCT Urinalysis Dipstick     Status: Abnormal   Collection Time: 04/17/20  8:53 AM  Result Value Ref Range    Color, UA dark yellow    Clarity, UA turbid    Glucose, UA Negative Negative   Bilirubin, UA Negative    Ketones, UA Negative    Spec Grav, UA >=1.030 (A) 1.010 - 1.025   Blood, UA Negative    pH, UA 6.0 5.0 - 8.0   Protein, UA Positive (A) Negative   Urobilinogen, UA 0.2 0.2 or 1.0 E.U./dL   Nitrite, UA Negative    Leukocytes, UA Negative Negative   Appearance     Odor      Assessment/Plan: 1. Urinary frequency Unclear etiology.  No other symptoms to suggest cystitis.  She is young so a cystocele or pelvic prolapse much less likely.  If work-up unremarkable we will have her follow with her GYN for further assessment.  UA today unremarkable for sign of infection.  Did show trace protein and slightly cloudy.  As such we will add on urine culture and urine microscopy.  Will check serum pregnancy today.  She is to keep well-hydrated and to avoid caffeine and other bladder irritating foods. - POCT Urinalysis Dipstick - Urine Culture - Urine Microscopic Only - B-HCG Quant  2. Other proteinuria We will obtain further urine studies  and labs to assess in more detail.  May need to consider imaging or referral to urology giving this finding plus her ongoing urinary frequency. - Urine Culture - Urine Microscopic Only - CBC with Differential/Platelet - Comp Met (CMET)  This visit occurred during the SARS-CoV-2 public health emergency.  Safety protocols were in place, including screening questions prior to the visit, additional usage of staff PPE, and extensive cleaning of exam room while observing appropriate contact time as indicated for disinfecting solutions.     Leeanne Rio, PA-C

## 2020-04-18 LAB — URINE CULTURE
MICRO NUMBER:: 11018955
SPECIMEN QUALITY:: ADEQUATE

## 2020-04-22 ENCOUNTER — Other Ambulatory Visit: Payer: Self-pay | Admitting: Physician Assistant

## 2020-04-22 ENCOUNTER — Telehealth: Payer: Self-pay | Admitting: Physician Assistant

## 2020-04-22 MED ORDER — FLUCONAZOLE 150 MG PO TABS: 150.0000 mg | ORAL_TABLET | Freq: Once | ORAL | 0 refills | Status: AC

## 2020-04-22 MED ORDER — AMOXICILLIN-POT CLAVULANATE 500-125 MG PO TABS
1.0000 | ORAL_TABLET | Freq: Three times a day (TID) | ORAL | 0 refills | Status: DC
Start: 2020-04-22 — End: 2021-10-22

## 2020-04-22 NOTE — Telephone Encounter (Signed)
Made in error

## 2020-04-24 ENCOUNTER — Ambulatory Visit: Payer: 59 | Admitting: Physician Assistant

## 2020-09-09 ENCOUNTER — Other Ambulatory Visit: Payer: Self-pay | Admitting: Physician Assistant

## 2020-09-09 DIAGNOSIS — K219 Gastro-esophageal reflux disease without esophagitis: Secondary | ICD-10-CM

## 2021-01-13 ENCOUNTER — Telehealth: Payer: Self-pay

## 2021-01-13 NOTE — Telephone Encounter (Signed)
Called patient and scheduled mychart visit for 01/14/21.

## 2021-01-13 NOTE — Telephone Encounter (Signed)
Pt works for [employer] tested positive for Covid over two weeks ago Pt is still congested and coughing and lot of shortness of breath still can she get something called in for this? She has a TOC appt on Thursday July 14th with Richard.  Pt call back [phone]

## 2021-01-14 ENCOUNTER — Other Ambulatory Visit: Payer: Self-pay

## 2021-01-14 ENCOUNTER — Encounter: Payer: Self-pay | Admitting: Registered Nurse

## 2021-01-14 ENCOUNTER — Telehealth (INDEPENDENT_AMBULATORY_CARE_PROVIDER_SITE_OTHER): Payer: Self-pay | Admitting: Registered Nurse

## 2021-01-14 DIAGNOSIS — J22 Unspecified acute lower respiratory infection: Secondary | ICD-10-CM

## 2021-01-14 MED ORDER — AZITHROMYCIN 250 MG PO TABS: ORAL_TABLET | ORAL | 0 refills | Status: AC

## 2021-01-14 MED ORDER — PREDNISONE 10 MG (21) PO TBPK: ORAL_TABLET | ORAL | 0 refills | Status: DC

## 2021-01-14 NOTE — Patient Instructions (Signed)
° ° ° °  If you have lab work done today you will be contacted with your lab results within the next 2 weeks.  If you have not heard from us then please contact us. The fastest way to get your results is to register for My Chart. ° ° °IF you received an x-ray today, you will receive an invoice from [facility]. Please contact [facility] at [phone] with questions or concerns regarding your invoice.  ° °IF you received labwork today, you will receive an invoice from [facility]. Please contact [facility] at [phone] with questions or concerns regarding your invoice.  ° °Our billing staff will not be able to assist you with questions regarding bills from these companies. ° °You will be contacted with the lab results as soon as they are available. The fastest way to get your results is to activate your My Chart account. Instructions are located on the last page of this paperwork. If you have not heard from us regarding the results in 2 weeks, please contact this office. °  ° ° ° °

## 2021-01-14 NOTE — Progress Notes (Signed)
Sonya Agee, NP   Telemedicine Encounter- SOAP NOTE Established Patient  This telephone encounter was conducted with the patient's (or proxy's) verbal consent via audio telecommunications: yes/no: Yes Patient was instructed to have this encounter in a suitably private space; and to only have persons present to whom they give permission to participate. In addition, patient identity was confirmed by use of name plus two identifiers (DOB and address).  I discussed the limitations, risks, security and privacy concerns of performing an evaluation and management service by telephone and the availability of in person appointments. I also discussed with the patient that there may be a patient responsible charge related to this service. The patient expressed understanding and agreed to proceed.  I spent a total of 15 minutes talking with the patient or their proxy.  Patient at home Provider in office  Participants: Jari Sportsman, NP and Jeanmarie Hubert  Chief Complaint  Patient presents with   Nasal Congestion    Patient states she had covid a few weeks ago and is still experiencing some nasal congestion and Sob at times.     Subjective   Sonya Santiago is a 25 y.o. established patient. Telephone visit today for lingering COVID  HPI Initially tested positive on the 13th for COVID Did OTC meds Lasted 1-2 weeks, but gradually improved Weakness and fatigue were worst symptoms. Still a lingering sore throat   Patient Active Problem List   Diagnosis Date Noted   Major depressive disorder, recurrent episode, severe (HCC) 01/26/2020   OD (overdose of drug), intentional self-harm, initial encounter (HCC) 01/26/2020   MDD (major depressive disorder) 01/24/2020   Binge eating disorder 12/25/2019   Pre-diabetes 11/15/2018   Gastroesophageal reflux disease without esophagitis 11/15/2018   Morbid obesity (HCC) 11/15/2018   Infertility, anovulation 08/30/2016   Anovulation 08/09/2016    Past  Medical History:  Diagnosis Date   Allergy    Anxiety    GERD (gastroesophageal reflux disease)    Pre-diabetes     Current Outpatient Medications  Medication Sig Dispense Refill   predniSONE (STERAPRED UNI-PAK 21 TAB) 10 MG (21) TBPK tablet Take per package instructions. Do not skip doses. Finish entire supply. 1 each 0   albuterol (VENTOLIN HFA) 108 (90 Base) MCG/ACT inhaler Inhale 2 puffs into the lungs every 6 (six) hours as needed for wheezing or shortness of breath. 8 g 0   amoxicillin-clavulanate (AUGMENTIN) 500-125 MG tablet Take 1 tablet (500 mg total) by mouth 3 (three) times daily. (Patient not taking: No sig reported) 14 tablet 0   DULoxetine 40 MG CPEP Take 40 mg by mouth daily. 30 capsule 1   hydrOXYzine (ATARAX/VISTARIL) 25 MG tablet Take 1 tablet (25 mg total) by mouth every 8 (eight) hours as needed for anxiety (anxiety/agitation or CIWA < or = 10). (Patient not taking: No sig reported) 30 tablet 0   pantoprazole (PROTONIX) 40 MG tablet TAKE 1 TABLET(40 MG) BY MOUTH DAILY (Patient not taking: No sig reported) 90 tablet 0   No current facility-administered medications for this visit.    No Known Allergies  Social History   Socioeconomic History   Marital status: Married    Spouse name: Not on file   Number of children: 0   Years of education: Not on file   Highest education level: Not on file  Occupational History   Not on file  Tobacco Use   Smoking status: Former    Packs/day: 0.10    Types: Cigarettes    Start date:  08/13/2013    Quit date: 07/12/2014    Years since quitting: 6.8   Smokeless tobacco: Never  Vaping Use   Vaping Use: Former  Substance and Sexual Activity   Alcohol use: Not Currently   Drug use: Not Currently    Types: Other-see comments   Sexual activity: Yes    Birth control/protection: None  Other Topics Concern   Not on file  Social History Narrative   Not on file   Social Determinants of Health   Financial Resource Strain:  Not on file  Food Insecurity: Not on file  Transportation Needs: Not on file  Physical Activity: Not on file  Stress: Not on file  Social Connections: Not on file  Intimate Partner Violence: Not on file    ROS  Objective   Vitals as reported by the patient: There were no vitals filed for this visit.  Sonya Santiago was seen today for nasal congestion.  Diagnoses and all orders for this visit:  Lower respiratory infection -     azithromycin (ZITHROMAX) 250 MG tablet; Take 2 tablets on day 1, then 1 tablet daily on days 2 through 5 -     predniSONE (STERAPRED UNI-PAK 21 TAB) 10 MG (21) TBPK tablet; Take per package instructions. Do not skip doses. Finish entire supply.   PLAN Concern for bacterial etiology. Z pack and prednisone as above If worsening or failing to improve can consider ENT  Patient encouraged to call clinic with any questions, comments, or concerns.  I discussed the assessment and treatment plan with the patient. The patient was provided an opportunity to ask questions and all were answered. The patient agreed with the plan and demonstrated an understanding of the instructions.   The patient was advised to call back or seek an in-person evaluation if the symptoms worsen or if the condition fails to improve as anticipated.  I provided 15 minutes of non-face-to-face time during this encounter.  Sonya Agee, NP

## 2021-01-29 ENCOUNTER — Ambulatory Visit: Payer: 59 | Admitting: Registered Nurse

## 2021-01-29 ENCOUNTER — Encounter: Payer: Self-pay | Admitting: Registered Nurse

## 2021-01-29 ENCOUNTER — Other Ambulatory Visit: Payer: Self-pay

## 2021-01-29 VITALS — BP 125/71 | HR 100 | Temp 98.2°F | Resp 18 | Ht 63.0 in | Wt 248.0 lb

## 2021-01-29 DIAGNOSIS — Z130 Encounter for screening for diseases of the blood and blood-forming organs and certain disorders involving the immune mechanism: Secondary | ICD-10-CM

## 2021-01-29 DIAGNOSIS — Z13228 Encounter for screening for other metabolic disorders: Secondary | ICD-10-CM | POA: Diagnosis not present

## 2021-01-29 DIAGNOSIS — Z13220 Encounter for screening for lipoid disorders: Secondary | ICD-10-CM | POA: Diagnosis not present

## 2021-01-29 DIAGNOSIS — R7303 Prediabetes: Secondary | ICD-10-CM | POA: Diagnosis not present

## 2021-01-29 DIAGNOSIS — R0602 Shortness of breath: Secondary | ICD-10-CM

## 2021-01-29 DIAGNOSIS — Z1329 Encounter for screening for other suspected endocrine disorder: Secondary | ICD-10-CM

## 2021-01-29 DIAGNOSIS — Z8481 Family history of carrier of genetic disease: Secondary | ICD-10-CM | POA: Diagnosis not present

## 2021-01-29 LAB — COMPREHENSIVE METABOLIC PANEL
ALT: 29 U/L (ref 0–35)
AST: 12 U/L (ref 0–37)
Albumin: 4.3 g/dL (ref 3.5–5.2)
Alkaline Phosphatase: 68 U/L (ref 39–117)
BUN: 13 mg/dL (ref 6–23)
CO2: 26 mEq/L (ref 19–32)
Calcium: 9.2 mg/dL (ref 8.4–10.5)
Chloride: 103 mEq/L (ref 96–112)
Creatinine, Ser: 0.68 mg/dL (ref 0.40–1.20)
GFR: 119.73 mL/min (ref >60.00)
Glucose, Bld: 151 mg/dL — ABNORMAL HIGH (ref 70–99)
Potassium: 4 mEq/L (ref 3.5–5.1)
Sodium: 137 mEq/L (ref 135–145)
Total Bilirubin: 0.3 mg/dL (ref 0.2–1.2)
Total Protein: 6.9 g/dL (ref 6.0–8.3)

## 2021-01-29 LAB — TSH: TSH: 2.44 u[IU]/mL (ref 0.35–5.50)

## 2021-01-29 LAB — CBC WITH DIFFERENTIAL/PLATELET
Basophils Absolute: 0.1 10*3/uL (ref 0.0–0.1)
Basophils Relative: 0.3 % (ref 0.0–3.0)
Eosinophils Absolute: 0 10*3/uL (ref 0.0–0.7)
Eosinophils Relative: 0.1 % (ref 0.0–5.0)
HCT: 33.4 % — ABNORMAL LOW (ref 36.0–46.0)
Hemoglobin: 10.8 g/dL — ABNORMAL LOW (ref 12.0–15.0)
Lymphocytes Relative: 24.7 % (ref 12.0–46.0)
Lymphs Abs: 4.4 10*3/uL — ABNORMAL HIGH (ref 0.7–4.0)
MCHC: 32.2 g/dL (ref 30.0–36.0)
MCV: 73 fl — ABNORMAL LOW (ref 78.0–100.0)
Monocytes Absolute: 1.2 10*3/uL — ABNORMAL HIGH (ref 0.1–1.0)
Monocytes Relative: 6.8 % (ref 3.0–12.0)
Neutro Abs: 12 10*3/uL — ABNORMAL HIGH (ref 1.4–7.7)
Neutrophils Relative %: 68.1 % (ref 43.0–77.0)
Platelets: 541 10*3/uL — ABNORMAL HIGH (ref 150.0–400.0)
RBC: 4.58 Mil/uL (ref 3.87–5.11)
RDW: 17.4 % — ABNORMAL HIGH (ref 11.5–15.5)
WBC: 17.6 10*3/uL — ABNORMAL HIGH (ref 4.0–10.5)

## 2021-01-29 LAB — LIPID PANEL
Cholesterol: 206 mg/dL — ABNORMAL HIGH (ref 0–200)
HDL: 46.3 mg/dL (ref >39.00)
NonHDL: 159.52
Total CHOL/HDL Ratio: 4
Triglycerides: 296 mg/dL — ABNORMAL HIGH (ref 0.0–149.0)
VLDL: 59.2 mg/dL — ABNORMAL HIGH (ref 0.0–40.0)

## 2021-01-29 LAB — HEMOGLOBIN A1C: Hgb A1c MFr Bld: 6.5 % (ref 4.6–6.5)

## 2021-01-29 LAB — LDL CHOLESTEROL, DIRECT: Direct LDL: 144 mg/dL

## 2021-01-29 MED ORDER — ALBUTEROL SULFATE HFA 108 (90 BASE) MCG/ACT IN AERS: 2.0000 | INHALATION_SPRAY | Freq: Four times a day (QID) | RESPIRATORY_TRACT | 0 refills | Status: DC | PRN

## 2021-01-29 NOTE — Patient Instructions (Addendum)
Ms. Daleyza Gadomski to meet you (in person!)  Let me know if symptoms persist or worsen. Ok to use albuterol as needed  If orthostatics are abnormal will drop referral in to cardiology  Will check labs today to see if underlying contributor to symptoms  See you annually unless labs indicate otherwise  Thank you  Rich     If you have lab work done today you will be contacted with your lab results within the next 2 weeks.  If you have not heard from [place] then please contact us. The fastest way to get your results is to register for My Chart.   IF you received an x-ray today, you will receive an invoice from [facility]. Please contact [facility] at [phone] with questions or concerns regarding your invoice.   IF you received labwork today, you will receive an invoice from [facility]. Please contact [facility] at [phone] with questions or concerns regarding your invoice.   Our billing staff will not be able to assist you with questions regarding bills from these companies.  You will be contacted with the lab results as soon as they are available. The fastest way to get your results is to activate your My Chart account. Instructions are located on the last page of this paperwork. If you have not heard from [place] regarding the results in 2 weeks, please contact this office.

## 2021-01-29 NOTE — Progress Notes (Signed)
Established Patient Office Visit  Subjective:  Patient ID: Sonya Santiago, female    DOB: May 18, 1996  Age: 25 y.o. MRN: [id number]  CC:  Chief Complaint  Patient presents with   Transitions Of Care    Patient states she is here for TOC. Patient still having symptoms after covid of increased heart rate, sweats, thristy and elevated BP.    HPI Sonya Santiago presents for visit to est care  Lingering covid symptoms: increased heart rate, sweats, thirsty, elevated bp Bp normal on arrival today, HR borderline Notes initially tested positive 12/29/20, was seen by me virtually on 01/14/21 for ongoing symptoms and given z pack and prednisone burst.   Histories reviewed and updated with patient.   Fam hx notable for known BRCA mutation. Pt would like referral to genetic counseling.  Past Medical History:  Diagnosis Date   Allergy    Anxiety    GERD (gastroesophageal reflux disease)    Pre-diabetes     Past Surgical History:  Procedure Laterality Date   TONSILLECTOMY AND ADENOIDECTOMY     WISDOM TOOTH EXTRACTION      Family History  Problem Relation Age of Onset   Cancer Mother        Breast, Thyroid   Asthma Brother    Cancer Maternal Grandmother    Diabetes Maternal Grandfather    Heart disease Maternal Grandfather    Diabetes Paternal Grandmother    Cirrhosis Paternal Grandfather    Alcoholism Paternal Grandfather     Social History   Socioeconomic History   Marital status: Married    Spouse name: Not on file   Number of children: 0   Years of education: Not on file   Highest education level: Not on file  Occupational History   Not on file  Tobacco Use   Smoking status: Former    Packs/day: 0.10    Types: Cigarettes    Start date: 08/13/2013    Quit date: 07/12/2014    Years since quitting: 6.8   Smokeless tobacco: Never  Vaping Use   Vaping Use: Former  Substance and Sexual Activity   Alcohol use: Not Currently   Drug use: Not Currently    Types:  Other-see comments   Sexual activity: Yes    Birth control/protection: None  Other Topics Concern   Not on file  Social History Narrative   Not on file   Social Determinants of Health   Financial Resource Strain: Not on file  Food Insecurity: Not on file  Transportation Needs: Not on file  Physical Activity: Not on file  Stress: Not on file  Social Connections: Not on file  Intimate Partner Violence: Not on file    Outpatient Medications Prior to Visit  Medication Sig Dispense Refill   predniSONE (STERAPRED UNI-PAK 21 TAB) 10 MG (21) TBPK tablet Take per package instructions. Do not skip doses. Finish entire supply. 1 each 0   amoxicillin-clavulanate (AUGMENTIN) 500-125 MG tablet Take 1 tablet (500 mg total) by mouth 3 (three) times daily. (Patient not taking: No sig reported) 14 tablet 0   DULoxetine 40 MG CPEP Take 40 mg by mouth daily. 30 capsule 1   hydrOXYzine (ATARAX/VISTARIL) 25 MG tablet Take 1 tablet (25 mg total) by mouth every 8 (eight) hours as needed for anxiety (anxiety/agitation or CIWA < or = 10). (Patient not taking: No sig reported) 30 tablet 0   pantoprazole (PROTONIX) 40 MG tablet TAKE 1 TABLET(40 MG) BY MOUTH DAILY (Patient not taking: No sig  reported) 90 tablet 0   No facility-administered medications prior to visit.    No Known Allergies  ROS Review of Systems  Constitutional: Negative.   HENT: Negative.    Eyes: Negative.   Respiratory: Negative.    Cardiovascular: Negative.   Gastrointestinal: Negative.   Genitourinary: Negative.   Musculoskeletal: Negative.   Skin: Negative.   Neurological: Negative.   Psychiatric/Behavioral: Negative.    All other systems reviewed and are negative.    Objective:    Physical Exam Vitals and nursing note reviewed.  Constitutional:      General: She is not in acute distress.    Appearance: Normal appearance. She is normal weight. She is not ill-appearing, toxic-appearing or diaphoretic.  Cardiovascular:      Rate and Rhythm: Normal rate and regular rhythm.     Heart sounds: Normal heart sounds. No murmur heard.   No friction rub. No gallop.  Pulmonary:     Effort: Pulmonary effort is normal. No respiratory distress.     Breath sounds: Normal breath sounds. No stridor. No wheezing, rhonchi or rales.  Chest:     Chest wall: No tenderness.  Skin:    General: Skin is warm and dry.  Neurological:     General: No focal deficit present.     Mental Status: She is alert and oriented to person, place, and time. Mental status is at baseline.  Psychiatric:        Mood and Affect: Mood normal.        Behavior: Behavior normal.        Thought Content: Thought content normal.        Judgment: Judgment normal.    BP 125/71   Pulse 100   Temp 98.2 F (36.8 C) (Temporal)   Resp 18   Ht '5\' 3"'  (1.6 m)   Wt 248 lb (112.5 kg)   SpO2 100%   BMI 43.93 kg/m  Wt Readings from Last 3 Encounters:  01/29/21 248 lb (112.5 kg)  04/17/20 248 lb 9.6 oz (112.8 kg)  01/21/20 230 lb (104.3 kg)     Health Maintenance Due  Topic Date Due   PAP SMEAR-Modifier  Never done   COVID-19 Vaccine (3 - Booster for Pfizer series) 09/15/2020   INFLUENZA VACCINE  02/16/2021   TETANUS/TDAP  03/25/2021    There are no preventive care reminders to display for this patient.  Lab Results  Component Value Date   TSH 2.44 01/29/2021   Lab Results  Component Value Date   WBC 17.6 (H) 01/29/2021   HGB 10.8 (L) 01/29/2021   HCT 33.4 (L) 01/29/2021   MCV 73.0 (L) 01/29/2021   PLT 541.0 (H) 01/29/2021   Lab Results  Component Value Date   NA 137 01/29/2021   K 4.0 01/29/2021   CO2 26 01/29/2021   GLUCOSE 151 (H) 01/29/2021   BUN 13 01/29/2021   CREATININE 0.68 01/29/2021   BILITOT 0.3 01/29/2021   ALKPHOS 68 01/29/2021   AST 12 01/29/2021   ALT 29 01/29/2021   PROT 6.9 01/29/2021   ALBUMIN 4.3 01/29/2021   CALCIUM 9.2 01/29/2021   ANIONGAP 12 01/21/2020   GFR 119.73 01/29/2021   Lab Results   Component Value Date   CHOL 206 (H) 01/29/2021   Lab Results  Component Value Date   HDL 46.30 01/29/2021   Lab Results  Component Value Date   LDLCALC 148 (H) 01/25/2020   Lab Results  Component Value Date   TRIG 296.0 (H)  01/29/2021   Lab Results  Component Value Date   CHOLHDL 4 01/29/2021   Lab Results  Component Value Date   HGBA1C 6.5 01/29/2021      Assessment & Plan:   Problem List Items Addressed This Visit       Other   Pre-diabetes   Relevant Orders   Hemoglobin A1c (Completed)   Other Visit Diagnoses     Mild shortness of breath    -  Primary   Relevant Medications   albuterol (VENTOLIN HFA) 108 (90 Base) MCG/ACT inhaler   Family history of BRCA gene mutation       Relevant Orders   Ambulatory referral to Genetics   Screening for endocrine, metabolic and immunity disorder       Relevant Orders   CBC with Differential/Platelet (Completed)   Comprehensive metabolic panel (Completed)   Hemoglobin A1c (Completed)   TSH (Completed)   Lipid screening       Relevant Orders   Lipid panel (Completed)   LDL cholesterol, direct (Completed)       Meds ordered this encounter  Medications   albuterol (VENTOLIN HFA) 108 (90 Base) MCG/ACT inhaler    Sig: Inhale 2 puffs into the lungs every 6 (six) hours as needed for wheezing or shortness of breath.    Dispense:  8 g    Refill:  0    Order Specific Question:   Supervising Provider    Answer:   Carlota Raspberry, JEFFREY R [[id number]]    Follow-up: No follow-ups on file.   PLAN Albuterol prn for lingering covid symptoms. Labs collected. Will follow up with the patient as warranted. If labs normal, return annually. Refer to genetics Patient encouraged to call clinic with any questions, comments, or concerns.  Maximiano Coss, NP

## 2021-01-30 ENCOUNTER — Encounter: Payer: Self-pay | Admitting: Registered Nurse

## 2021-02-06 ENCOUNTER — Telehealth: Payer: Self-pay | Admitting: Genetic Counselor

## 2021-02-06 NOTE — Telephone Encounter (Signed)
Scheduled appt per referral. Pt aware.  

## 2021-02-25 ENCOUNTER — Inpatient Hospital Stay: Payer: 59

## 2021-02-25 ENCOUNTER — Inpatient Hospital Stay: Payer: 59 | Admitting: Genetic Counselor

## 2021-05-11 NOTE — Telephone Encounter (Signed)
This concern has been previously addressed by myself and/or another provider.  If they patient has ongoing concerns, they can contact me at their convenience.  Thank you,  Rich Naresh Althaus, NP 

## 2021-09-10 ENCOUNTER — Encounter: Payer: Self-pay | Admitting: Registered Nurse

## 2021-09-10 NOTE — Telephone Encounter (Signed)
Attempted to call the patient to schedule an appointment for medication to be prescribed

## 2021-09-18 LAB — HM PAP SMEAR: HM Pap smear: NORMAL

## 2021-10-15 ENCOUNTER — Telehealth (INDEPENDENT_AMBULATORY_CARE_PROVIDER_SITE_OTHER): Payer: 59 | Admitting: Registered Nurse

## 2021-10-15 ENCOUNTER — Encounter: Payer: Self-pay | Admitting: Registered Nurse

## 2021-10-15 ENCOUNTER — Other Ambulatory Visit: Payer: Self-pay

## 2021-10-15 VITALS — BP 123/76 | Wt 260.0 lb

## 2021-10-15 DIAGNOSIS — Z6841 Body Mass Index (BMI) 40.0 and over, adult: Secondary | ICD-10-CM

## 2021-10-15 NOTE — Patient Instructions (Signed)
° ° ° °  If you have lab work done today you will be contacted with your lab results within the next 2 weeks.  If you have not heard from us then please contact us. The fastest way to get your results is to register for My Chart. ° ° °IF you received an x-ray today, you will receive an invoice from [facility]. Please contact [facility] at [phone] with questions or concerns regarding your invoice.  ° °IF you received labwork today, you will receive an invoice from [facility]. Please contact [facility] at [phone] with questions or concerns regarding your invoice.  ° °Our billing staff will not be able to assist you with questions regarding bills from these companies. ° °You will be contacted with the lab results as soon as they are available. The fastest way to get your results is to activate your My Chart account. Instructions are located on the last page of this paperwork. If you have not heard from us regarding the results in 2 weeks, please contact this office. °  ° ° ° °

## 2021-10-15 NOTE — Progress Notes (Signed)
? ? ?Telemedicine Encounter- SOAP NOTE Established Patient ? ?This telephone encounter was conducted with the patient's (or proxy's) verbal consent via audio telecommunications: yes/no: Yes ?Patient was instructed to have this encounter in a suitably private space; and to only have persons present to whom they give permission to participate. In addition, patient identity was confirmed by use of name plus two identifiers (DOB and address).  I discussed the limitations, risks, security and privacy concerns of performing an evaluation and management service by telephone and the availability of in person appointments. I also discussed with the patient that there may be a patient responsible charge related to this service. The patient expressed understanding and agreed to proceed. ? ?I spent a total of 14 minutes talking with the patient or their proxy. ? ?Patient at home ?Provider in office ? ?Participants: Jari Sportsman, NP and Jeanmarie Hubert ? ?Chief Complaint  ?Patient presents with  ? Weight Loss  ?  Patient states she is having some issues with her weight and would like to discuss trying a medication. And also anxiety  ? ? ?Subjective  ? ?Sonya Santiago is a 26 y.o. established patient. Telephone visit today for weight management ? ?HPI ?We have discussed in depth the pursuit of weight management through diet and exercise ?She is interested in pharmacologic options for assistance in this. ?She is interested in this for the preventative health reasons ? ?Patient Active Problem List  ? Diagnosis Date Noted  ? Major depressive disorder, recurrent episode, severe (HCC) 01/26/2020  ? OD (overdose of drug), intentional self-harm, initial encounter (HCC) 01/26/2020  ? MDD (major depressive disorder) 01/24/2020  ? Binge eating disorder 12/25/2019  ? Pre-diabetes 11/15/2018  ? Gastroesophageal reflux disease without esophagitis 11/15/2018  ? Morbid obesity (HCC) 11/15/2018  ? Infertility, anovulation 08/30/2016  ? Anovulation  08/09/2016  ? ? ?Past Medical History:  ?Diagnosis Date  ? Allergy   ? Anxiety   ? GERD (gastroesophageal reflux disease)   ? Pre-diabetes   ? ? ?Current Outpatient Medications  ?Medication Sig Dispense Refill  ? albuterol (VENTOLIN HFA) 108 (90 Base) MCG/ACT inhaler Inhale 2 puffs into the lungs every 6 (six) hours as needed for wheezing or shortness of breath. 8 g 0  ? Semaglutide,0.25 or 0.5MG /DOS, 2 MG/1.5ML SOPN Inject 0.5 mg into the skin once a week. 1.5 mL 0  ? DULoxetine 40 MG CPEP Take 40 mg by mouth daily. 30 capsule 1  ? hydrOXYzine (ATARAX) 25 MG tablet Take 1 tablet (25 mg total) by mouth every 8 (eight) hours as needed for anxiety (anxiety/agitation or CIWA < or = 10). 30 tablet 0  ? ?No current facility-administered medications for this visit.  ? ? ?No Known Allergies ? ?Social History  ? ?Socioeconomic History  ? Marital status: Married  ?  Spouse name: Not on file  ? Number of children: 0  ? Years of education: Not on file  ? Highest education level: Not on file  ?Occupational History  ? Not on file  ?Tobacco Use  ? Smoking status: Former  ?  Packs/day: 0.10  ?  Types: Cigarettes  ?  Start date: 08/13/2013  ?  Quit date: 07/12/2014  ?  Years since quitting: 7.3  ? Smokeless tobacco: Never  ?Vaping Use  ? Vaping Use: Former  ?Substance and Sexual Activity  ? Alcohol use: Not Currently  ? Drug use: Not Currently  ?  Types: Other-see comments  ? Sexual activity: Yes  ?  Birth control/protection: None  ?  Other Topics Concern  ? Not on file  ?Social History Narrative  ? Not on file  ? ?Social Determinants of Health  ? ?Financial Resource Strain: Not on file  ?Food Insecurity: Not on file  ?Transportation Needs: Not on file  ?Physical Activity: Not on file  ?Stress: Not on file  ?Social Connections: Not on file  ?Intimate Partner Violence: Not on file  ? ? ?ROS ?Per hpi  ? ?Objective  ? ?Vitals as reported by the patient: ?Today's Vitals  ? 10/15/21 1152  ?BP: 123/76  ?Weight: 260 lb (117.9 kg)   ? ? ?[name] was seen today for weight loss. ? ?Diagnoses and all orders for this visit: ? ?Class 3 severe obesity due to excess calories without serious comorbidity with body mass index (BMI) of 45.0 to 49.9 in adult [facility]) ?-     Semaglutide,0.25 or 0.5MG /DOS, 2 MG/1.5ML SOPN; Inject 0.5 mg into the skin once a week. ? ? ?PLAN ?Review r/b/se of semaglutide. Will start at low dose, increase in 1 mo if tolerating well. Weight check within 3-4 mo ?Patient encouraged to call clinic with any questions, comments, or concerns. ? ? ?I discussed the assessment and treatment plan with the patient. The patient was provided an opportunity to ask questions and all were answered. The patient agreed with the plan and demonstrated an understanding of the instructions. ?  ?The patient was advised to call back or seek an in-person evaluation if the symptoms worsen or if the condition fails to improve as anticipated. ? ?I provided 14 minutes of non-face-to-face time during this encounter. ? ?Janeece Agee, NP ? ?

## 2021-10-16 ENCOUNTER — Encounter: Payer: Self-pay | Admitting: Registered Nurse

## 2021-10-19 NOTE — Telephone Encounter (Signed)
Relationship To Patient Self ?Return Phone Number [phone] (Primary) ?Chief Complaint Prescription Refill or Medication Request (non ?symptomatic) ?Reason for Call Medication Question / Request ?Initial Comment Caller states that her prescription was not sent ?to the pharmacy .Caller states that this is a new ?prescription. ?Translation No ?Nurse Assessment ?Nurse: Rolena Infante, RN, [place] Date/Time (Eastern Time): 10/16/2021 5:34:34 PM ?Confirm and document reason for call. If ?symptomatic, describe symptoms. ?---Caller states that her prescription was not sent to the ?pharmacy .Caller states that this is a new prescription. ?Ozempic and hydroxyzine per tele health visit ?yesterday. Pt denies any new or worsening symptoms ?at this time. ?Does the patient have any new or worsening ?symptoms? ---No ?Nurse: Rolena Infante, RN, [place] Date/Time (Eastern Time): 10/16/2021 5:36:16 PM ?Please select the assessment type ---RX called in but not at pharm ?Additional Documentation ?---Caller states that her prescription was not sent to the ?pharmacy .Caller states that this is a new prescription. ?Ozempic and hydroxyzine per tele health visit ?yesterday. Pt denies any new or worsening symptoms ?at this time. Dr. Janeece Agee ?Document the name of the medication. ---Ozempic & Hydroxyzine ?Pharmacy name and phone number. ---[facility] [address] ?Has the office closed within the last 30 minutes? ---No ?Does the client directives allow for assistance with ?medications after hours? ---No ?PLEASE NOTE: All timestamps contained within this report are represented as [place] Standard Time. ?CONFIDENTIALTY NOTICE: This fax transmission is intended only for the addressee. It contains information that is legally privileged, confidential or ?otherwise protected from use or disclosure. If you are not the intended recipient, you are strictly prohibited from reviewing, disclosing, copying using ?or disseminating any of this information or  taking any action in reliance on or regarding this information. If you have received this fax in error, please ?notify us immediately by telephone so that we can arrange for its return to [place]. Phone: [phone], Toll-Free: [phone], Fax: [phone] ?Page: 2 of 2 ?Call Id: [id number] ?Disp. Time (Eastern ?Time) Disposition Final User ?10/16/2021 5:40:38 PM Pharmacy Call Rolena Infante, RN, [place] ?Reason: Called to verify medications ?were not available; they were not. ?10/16/2021 5:41:05 PM Clinical Call Yes Dunston, RN, [place] ?

## 2021-10-19 NOTE — Telephone Encounter (Signed)
Pt calling about scripts please advise last appt was 10/15/2021 with Rich ?

## 2021-10-20 NOTE — Telephone Encounter (Signed)
Patient is calling in regards to the message below.

## 2021-10-21 NOTE — Telephone Encounter (Signed)
Pt is called back about this, she was seen on Thursday. She wants to know what she needs to do.  ? ?Please advise, pt would like a phone call when this is taken care of  ?

## 2021-10-22 ENCOUNTER — Other Ambulatory Visit: Payer: Self-pay

## 2021-10-22 MED ORDER — SEMAGLUTIDE(0.25 OR 0.5MG/DOS) 2 MG/1.5ML ~~LOC~~ SOPN: 0.5000 mg | PEN_INJECTOR | SUBCUTANEOUS | 0 refills | Status: DC

## 2021-10-22 MED ORDER — HYDROXYZINE HCL 25 MG PO TABS: 25.0000 mg | ORAL_TABLET | Freq: Three times a day (TID) | ORAL | 0 refills | Status: DC | PRN

## 2021-10-22 NOTE — Telephone Encounter (Signed)
Looks like both meds sent today.  ?

## 2021-10-22 NOTE — Telephone Encounter (Signed)
Can either of you assist with this? Pt was seen last week on 10/15/21 and has been waiting on meds to  be sent in.  ? ? ?

## 2021-11-20 ENCOUNTER — Other Ambulatory Visit: Payer: Self-pay | Admitting: Registered Nurse

## 2021-11-20 MED ORDER — HYDROXYZINE HCL 25 MG PO TABS: 25.0000 mg | ORAL_TABLET | Freq: Three times a day (TID) | ORAL | 0 refills | Status: DC | PRN

## 2021-11-20 MED ORDER — SEMAGLUTIDE(0.25 OR 0.5MG/DOS) 2 MG/1.5ML ~~LOC~~ SOPN: 0.5000 mg | PEN_INJECTOR | SUBCUTANEOUS | 0 refills | Status: DC

## 2021-12-24 ENCOUNTER — Other Ambulatory Visit: Payer: Self-pay | Admitting: Registered Nurse

## 2021-12-24 MED ORDER — HYDROXYZINE HCL 25 MG PO TABS: 25.0000 mg | ORAL_TABLET | Freq: Three times a day (TID) | ORAL | 0 refills | Status: DC | PRN

## 2021-12-24 MED ORDER — SEMAGLUTIDE(0.25 OR 0.5MG/DOS) 2 MG/1.5ML ~~LOC~~ SOPN: 0.5000 mg | PEN_INJECTOR | SUBCUTANEOUS | 0 refills | Status: DC

## 2022-01-25 ENCOUNTER — Other Ambulatory Visit: Payer: Self-pay | Admitting: Registered Nurse

## 2022-01-25 MED ORDER — HYDROXYZINE HCL 25 MG PO TABS: 25.0000 mg | ORAL_TABLET | Freq: Three times a day (TID) | ORAL | 0 refills | Status: DC | PRN

## 2022-01-25 MED ORDER — SEMAGLUTIDE(0.25 OR 0.5MG/DOS) 2 MG/1.5ML ~~LOC~~ SOPN: 0.5000 mg | PEN_INJECTOR | SUBCUTANEOUS | 0 refills | Status: DC

## 2022-02-01 ENCOUNTER — Telehealth: Payer: Self-pay | Admitting: Registered Nurse

## 2022-02-01 NOTE — Telephone Encounter (Signed)
Encourage patient to contact the pharmacy for refills or they can request refills through [facility]  (Please schedule appointment if patient has not been seen in over a year)    WHAT PHARMACY WOULD THEY LIKE THIS SENT TO: [address]. [phone]  MEDICATION NAME & DOSE: Zoforan for nausea  NOTES/COMMENTS FROM PATIENT:      Front office please notify patient: It takes 48-72 hours to process rx refill requests Ask patient to call pharmacy to ensure rx is ready before heading there.

## 2022-02-02 ENCOUNTER — Encounter: Payer: Self-pay | Admitting: Family

## 2022-02-02 ENCOUNTER — Telehealth: Payer: 59 | Admitting: Registered Nurse

## 2022-02-02 ENCOUNTER — Ambulatory Visit (INDEPENDENT_AMBULATORY_CARE_PROVIDER_SITE_OTHER): Payer: 59 | Admitting: Family

## 2022-02-02 VITALS — BP 122/74 | HR 79 | Temp 98.0°F | Ht 63.0 in | Wt 226.5 lb

## 2022-02-02 DIAGNOSIS — T50905A Adverse effect of unspecified drugs, medicaments and biological substances, initial encounter: Secondary | ICD-10-CM | POA: Insufficient documentation

## 2022-02-02 DIAGNOSIS — R112 Nausea with vomiting, unspecified: Secondary | ICD-10-CM

## 2022-02-02 DIAGNOSIS — R7303 Prediabetes: Secondary | ICD-10-CM

## 2022-02-02 DIAGNOSIS — Z6841 Body Mass Index (BMI) 40.0 and over, adult: Secondary | ICD-10-CM

## 2022-02-02 MED ORDER — ONDANSETRON HCL 4 MG PO TABS: 4.0000 mg | ORAL_TABLET | Freq: Three times a day (TID) | ORAL | 0 refills | Status: DC | PRN

## 2022-02-02 MED ORDER — SEMAGLUTIDE(0.25 OR 0.5MG/DOS) 2 MG/1.5ML ~~LOC~~ SOPN: 0.5000 mg | PEN_INJECTOR | SUBCUTANEOUS | 0 refills | Status: DC

## 2022-02-02 NOTE — Assessment & Plan Note (Signed)
New - r/t Ozempic - advised on not overeating at one meal, try to eat 4-5 mini meals, do not go longer than 2 hours without eating and drink at least 2L water qd. Sending Zofran, advised on use & SE.

## 2022-02-02 NOTE — Assessment & Plan Note (Signed)
   chronic, morbidly obese  last A1C 6.5 - 01/2021  started on Ozempic, lost > 30lbs  no labs since last year  f/u 3 mos fasting labs

## 2022-02-02 NOTE — Progress Notes (Signed)
Patient ID: Sonya Santiago, female    DOB: Jan 15, 1996, 26 y.o.   MRN: [id number]  Chief Complaint  Patient presents with   Transfer of care    Weight Loss    Pt states she is on Ozempic and would like Zofran.     HPI: Obesity: pt reports chronic hx of abnormal weight gain and tried to manage on her own thru diet and exercise. She also reports she has prediabetes and is motivated to get her weight down. She reports starting Ozempic a few months ago and now currently at 0.5mg  dose and has lost over 30lbs since making lifestyle modifications and starting medication. Nausea: pt reports having off and on nausea r/t starting Ozempic. She is requesting medication to help with this.  Assessment & Plan:   Problem List Items Addressed This Visit       Digestive   Drug-induced nausea and vomiting    New - r/t Ozempic - advised on not overeating at one meal, try to eat 4-5 mini meals, do not go longer than 2 hours without eating and drink at least 2L water qd. Sending Zofran, advised on use & SE.      Relevant Medications   ondansetron (ZOFRAN) 4 MG tablet     Other   Pre-diabetes - Primary    chronic, morbidly obese last A1C 6.5 - 01/2021 started on Ozempic, lost > 30lbs no labs since last year f/u 3 mos fasting labs      Relevant Medications   Semaglutide,0.25 or 0.5MG /DOS, 2 MG/1.5ML SOPN   Class 3 severe obesity due to excess calories without serious comorbidity with body mass index (BMI) of 45.0 to 49.9 in adult [facility])   Relevant Medications   Semaglutide,0.25 or 0.5MG /DOS, 2 MG/1.5ML SOPN    Subjective:    Outpatient Medications Prior to Visit  Medication Sig Dispense Refill   hydrOXYzine (ATARAX) 25 MG tablet Take 1 tablet (25 mg total) by mouth every 8 (eight) hours as needed for anxiety (anxiety/agitation or CIWA < or = 10). 30 tablet 0   Semaglutide,0.25 or 0.5MG /DOS, 2 MG/1.5ML SOPN Inject 0.5 mg into the skin once a week. 1.5 mL 0   albuterol (VENTOLIN HFA) 108 (90 Base)  MCG/ACT inhaler Inhale 2 puffs into the lungs every 6 (six) hours as needed for wheezing or shortness of breath. 8 g 0   DULoxetine 40 MG CPEP Take 40 mg by mouth daily. 30 capsule 1   No facility-administered medications prior to visit.   Past Medical History:  Diagnosis Date   Allergy    Anxiety    GERD (gastroesophageal reflux disease)    MDD (major depressive disorder) 01/24/2020   Pre-diabetes    Past Surgical History:  Procedure Laterality Date   TONSILLECTOMY AND ADENOIDECTOMY     WISDOM TOOTH EXTRACTION     No Known Allergies    Objective:    Physical Exam Vitals and nursing note reviewed.  Constitutional:      Appearance: Normal appearance. She is morbidly obese.  Cardiovascular:     Rate and Rhythm: Normal rate and regular rhythm.  Pulmonary:     Effort: Pulmonary effort is normal.     Breath sounds: Normal breath sounds.  Musculoskeletal:        General: Normal range of motion.  Skin:    General: Skin is warm and dry.  Neurological:     Mental Status: She is alert.  Psychiatric:        Mood and Affect:  Mood normal.        Behavior: Behavior normal.    BP 122/74 (BP Location: Left Arm, Patient Position: Sitting, Cuff Size: Large)   Pulse 79   Temp 98 F (36.7 C) (Temporal)   Ht 5\' 3"  (1.6 m)   Wt 226 lb 8 oz (102.7 kg)   LMP 01/11/2022 (Exact Date)   SpO2 99%   BMI 40.12 kg/m  Wt Readings from Last 3 Encounters:  02/02/22 226 lb 8 oz (102.7 kg)  10/15/21 260 lb (117.9 kg)  01/29/21 248 lb (112.5 kg)       01/31/21, NP

## 2022-03-05 ENCOUNTER — Encounter: Payer: Self-pay | Admitting: Family

## 2022-03-05 ENCOUNTER — Telehealth (INDEPENDENT_AMBULATORY_CARE_PROVIDER_SITE_OTHER): Payer: 59 | Admitting: Family

## 2022-03-05 VITALS — Ht 63.0 in | Wt 221.0 lb

## 2022-03-05 DIAGNOSIS — R7303 Prediabetes: Secondary | ICD-10-CM

## 2022-03-05 DIAGNOSIS — T50905A Adverse effect of unspecified drugs, medicaments and biological substances, initial encounter: Secondary | ICD-10-CM

## 2022-03-05 DIAGNOSIS — R112 Nausea with vomiting, unspecified: Secondary | ICD-10-CM | POA: Diagnosis not present

## 2022-03-05 DIAGNOSIS — Z6841 Body Mass Index (BMI) 40.0 and over, adult: Secondary | ICD-10-CM

## 2022-03-05 MED ORDER — SEMAGLUTIDE (1 MG/DOSE) 4 MG/3ML ~~LOC~~ SOPN: 1.0000 mg | PEN_INJECTOR | SUBCUTANEOUS | 1 refills | Status: DC

## 2022-03-05 MED ORDER — ONDANSETRON HCL 4 MG PO TABS: 4.0000 mg | ORAL_TABLET | Freq: Three times a day (TID) | ORAL | 0 refills | Status: DC | PRN

## 2022-03-05 NOTE — Progress Notes (Signed)
MyChart Video Visit    Virtual Visit via Video Note   This format is felt to be most appropriate for this patient at this time. Physical exam was limited by quality of the video and audio technology used for the visit. CMA was able to get the patient set up on a video visit.  Patient location: Home. Patient and provider in visit Provider location: Office  I discussed the limitations of evaluation and management by telemedicine and the availability of in person appointments. The patient expressed understanding and agreed to proceed.  Visit Date: 03/05/2022  Today's healthcare provider: Dulce Sellar, NP     Subjective:   Patient ID: Sonya Santiago, female    DOB: 07-13-1996, 26 y.o.   MRN: [id number]  Chief Complaint  Patient presents with   Follow-up    Pre diabetes    Obesity    HPI Abnormal weight gain: pt reports chronic hx of abnormal weight gain and tried to manage on her own thru diet and exercise. She also reports she has prediabetes and is motivated to get her weight down. She has been on Ozempic about 4 months now, and currently at 0.5mg  dose and has lost over 30lbs since making lifestyle modifications and starting medication. Nausea: pt reports having off and on nausea r/t starting Ozempic. She reports improvement with sx  since starting Zofran, & d/t staying on 0.5mg  dose for longer time, but if increasing dose this time, she is requesting refill to help with this.   Assessment & Plan:   Problem List Items Addressed This Visit       Digestive   Drug-induced nausea and vomiting   Relevant Medications   ondansetron (ZOFRAN) 4 MG tablet     Other   Pre-diabetes - Primary    chronic, morbidly obese last A1C 6.5 - 01/2021 started on Ozempic, lost > 30lbs no labs since last year weight down another 3lbs reports mild reflux and constipation, counseling provided increasing dose to 1mg , she will continue 0.5mg  for 1 more month then start 1mg  f/u 3 mos  fasting labs      Relevant Medications   Semaglutide, 1 MG/DOSE, 4 MG/3ML SOPN   Class 3 severe obesity due to excess calories without serious comorbidity with body mass index (BMI) of 45.0 to 49.9 in adult [facility])    Chronic on Ozempic 0.5mg , increasing to 1mg  wt down another 3 lbs continue to advise on wt loss strategies including 2L water intake, portion control, exercise, not skipping meals      Relevant Medications   Semaglutide, 1 MG/DOSE, 4 MG/3ML SOPN    Past Medical History:  Diagnosis Date   Allergy    Anxiety    GERD (gastroesophageal reflux disease)    MDD (major depressive disorder) 01/24/2020   Pre-diabetes     Past Surgical History:  Procedure Laterality Date   TONSILLECTOMY AND ADENOIDECTOMY     WISDOM TOOTH EXTRACTION      Outpatient Medications Prior to Visit  Medication Sig Dispense Refill   hydrOXYzine (ATARAX) 25 MG tablet Take 1 tablet (25 mg total) by mouth every 8 (eight) hours as needed for anxiety (anxiety/agitation or CIWA < or = 10). 30 tablet 0   ondansetron (ZOFRAN) 4 MG tablet Take 1-2 tablets (4-8 mg total) by mouth every 8 (eight) hours as needed for nausea or vomiting. 20 tablet 0   Semaglutide,0.25 or 0.5MG /DOS, 2 MG/1.5ML SOPN Inject 0.5 mg into the skin once a week. 4.5 mL 0  No facility-administered medications prior to visit.    No Known Allergies     Objective:   Physical Exam Vitals and nursing note reviewed.  Constitutional:      General: She is not in acute distress.    Appearance: Normal appearance.  HENT:     Head: Normocephalic.  Pulmonary:     Effort: No respiratory distress.  Musculoskeletal:     Cervical back: Normal range of motion.  Skin:    General: Skin is dry.     Coloration: Skin is not pale.  Neurological:     Mental Status: She is alert and oriented to person, place, and time.  Psychiatric:        Mood and Affect: Mood normal.   Ht 5\' 3"  (1.6 m)   Wt 221 lb (100.2 kg)   LMP 02/14/2022 (Approximate)    BMI 39.15 kg/m   Wt Readings from Last 3 Encounters:  03/05/22 221 lb (100.2 kg)  02/02/22 226 lb 8 oz (102.7 kg)  10/15/21 260 lb (117.9 kg)    I discussed the assessment and treatment plan with the patient. The patient was provided an opportunity to ask questions and all were answered. The patient agreed with the plan and demonstrated an understanding of the instructions.   The patient was advised to call back or seek an in-person evaluation if the symptoms worsen or if the condition fails to improve as anticipated.  10/17/21, NP [facility] [phone] (phone) [phone] (fax)  [facility]

## 2022-03-05 NOTE — Assessment & Plan Note (Signed)
   Chronic  on Ozempic 0.5mg , increasing to 1mg   wt down another 3 lbs  continue to advise on wt loss strategies including 2L water intake, portion control, exercise, not skipping meals

## 2022-03-05 NOTE — Assessment & Plan Note (Addendum)
.   chronic, morbidly obese . last A1C 6.5 - 01/2021  started on Ozempic, lost > 30lbs . no labs since last year . weight down another 3lbs . reports mild reflux and constipation, counseling provided . increasing dose to 1mg , she will continue 0.5mg  for 1 more month then start 1mg  . f/u 3 mos fasting labs

## 2022-04-06 ENCOUNTER — Other Ambulatory Visit: Payer: Self-pay | Admitting: Family

## 2022-04-06 ENCOUNTER — Other Ambulatory Visit: Payer: Self-pay | Admitting: Family Medicine

## 2022-04-06 DIAGNOSIS — T50905A Adverse effect of unspecified drugs, medicaments and biological substances, initial encounter: Secondary | ICD-10-CM

## 2022-04-06 MED ORDER — HYDROXYZINE HCL 25 MG PO TABS: 25.0000 mg | ORAL_TABLET | Freq: Three times a day (TID) | ORAL | 0 refills | Status: DC | PRN

## 2022-04-06 MED ORDER — ONDANSETRON HCL 4 MG PO TABS: 4.0000 mg | ORAL_TABLET | Freq: Three times a day (TID) | ORAL | 0 refills | Status: DC | PRN

## 2022-05-01 ENCOUNTER — Other Ambulatory Visit: Payer: Self-pay | Admitting: Family

## 2022-05-01 ENCOUNTER — Other Ambulatory Visit: Payer: Self-pay | Admitting: Family Medicine

## 2022-05-01 ENCOUNTER — Encounter: Payer: Self-pay | Admitting: Family

## 2022-05-01 DIAGNOSIS — R112 Nausea with vomiting, unspecified: Secondary | ICD-10-CM

## 2022-05-03 ENCOUNTER — Other Ambulatory Visit: Payer: Self-pay | Admitting: Family

## 2022-05-03 ENCOUNTER — Other Ambulatory Visit: Payer: Self-pay

## 2022-05-03 DIAGNOSIS — R7303 Prediabetes: Secondary | ICD-10-CM

## 2022-05-03 DIAGNOSIS — T50905A Adverse effect of unspecified drugs, medicaments and biological substances, initial encounter: Secondary | ICD-10-CM

## 2022-05-03 MED ORDER — HYDROXYZINE HCL 25 MG PO TABS: 25.0000 mg | ORAL_TABLET | Freq: Three times a day (TID) | ORAL | 0 refills | Status: DC | PRN

## 2022-05-03 MED ORDER — SEMAGLUTIDE (1 MG/DOSE) 4 MG/3ML ~~LOC~~ SOPN: 1.0000 mg | PEN_INJECTOR | SUBCUTANEOUS | 1 refills | Status: DC

## 2022-06-02 ENCOUNTER — Encounter: Payer: Self-pay | Admitting: Family

## 2022-06-22 ENCOUNTER — Other Ambulatory Visit: Payer: Self-pay

## 2022-06-22 ENCOUNTER — Emergency Department (HOSPITAL_COMMUNITY)
Admission: EM | Admit: 2022-06-22 | Discharge: 2022-06-22 | Disposition: A | Discharge status: Home / Self Care | Payer: 59 | Attending: Emergency Medicine | Admitting: Emergency Medicine

## 2022-06-22 ENCOUNTER — Emergency Department (HOSPITAL_COMMUNITY): Payer: 59

## 2022-06-22 ENCOUNTER — Encounter (HOSPITAL_COMMUNITY): Payer: Self-pay | Admitting: *Deleted

## 2022-06-22 DIAGNOSIS — R42 Dizziness and giddiness: Secondary | ICD-10-CM | POA: Insufficient documentation

## 2022-06-22 DIAGNOSIS — R002 Palpitations: Secondary | ICD-10-CM | POA: Insufficient documentation

## 2022-06-22 DIAGNOSIS — R079 Chest pain, unspecified: Secondary | ICD-10-CM | POA: Diagnosis not present

## 2022-06-22 LAB — CBC
HCT: 36.7 % (ref 36.0–46.0)
Hemoglobin: 11.4 g/dL — ABNORMAL LOW (ref 12.0–15.0)
MCH: 24.2 pg — ABNORMAL LOW (ref 26.0–34.0)
MCHC: 31.1 g/dL (ref 30.0–36.0)
MCV: 77.9 fL — ABNORMAL LOW (ref 80.0–100.0)
Platelets: 452 10*3/uL — ABNORMAL HIGH (ref 150–400)
RBC: 4.71 MIL/uL (ref 3.87–5.11)
RDW: 15.5 % (ref 11.5–15.5)
WBC: 8.6 10*3/uL (ref 4.0–10.5)
nRBC: 0 % (ref 0.0–0.2)

## 2022-06-22 LAB — HEPATIC FUNCTION PANEL
ALT: 18 U/L (ref 0–44)
AST: 21 U/L (ref 15–41)
Albumin: 4.3 g/dL (ref 3.5–5.0)
Alkaline Phosphatase: 53 U/L (ref 38–126)
Bilirubin, Direct: 0.2 mg/dL (ref 0.0–0.2)
Indirect Bilirubin: 0.1 mg/dL — ABNORMAL LOW (ref 0.3–0.9)
Total Bilirubin: 0.3 mg/dL (ref 0.3–1.2)
Total Protein: 7.9 g/dL (ref 6.5–8.1)

## 2022-06-22 LAB — BASIC METABOLIC PANEL
Anion gap: 11 (ref 5–15)
BUN: 11 mg/dL (ref 6–20)
CO2: 24 mmol/L (ref 22–32)
Calcium: 9.2 mg/dL (ref 8.9–10.3)
Chloride: 103 mmol/L (ref 98–111)
Creatinine, Ser: 0.72 mg/dL (ref 0.44–1.00)
GFR, Estimated: >60 mL/min (ref >60)
Glucose, Bld: 95 mg/dL (ref 70–99)
Potassium: 3.8 mmol/L (ref 3.5–5.1)
Sodium: 138 mmol/L (ref 135–145)

## 2022-06-22 LAB — TROPONIN I (HIGH SENSITIVITY): Troponin I (High Sensitivity): 2 ng/L (ref <18)

## 2022-06-22 LAB — HCG, QUANTITATIVE, PREGNANCY: hCG, Beta Chain, Quant, S: <1 m[IU]/mL (ref <5)

## 2022-06-22 LAB — TSH: TSH: 2.45 u[IU]/mL (ref 0.350–4.500)

## 2022-06-22 MED ORDER — SODIUM CHLORIDE 0.9 % IV BOLUS
1000.0000 mL | Freq: Once | INTRAVENOUS | Status: AC
  Administered 2022-06-22: 1000 mL via INTRAVENOUS

## 2022-06-22 NOTE — ED Provider Notes (Signed)
[facility] EMERGENCY DEPARTMENT Provider Note   CSN: [id number] Arrival date & time: 06/22/22  3810     History  Chief Complaint  Patient presents with   Palpitations    Sonya Santiago is a 26 y.o. female.   Palpitations Patient presents with palpitations.  Has been feel dizzy and feels her heart racing.  At times she will feel chest pain.  No fevers or chills.  No cough.  Has felt a little off recently.  Is on Ozempic and doses on Sunday.  Feels her heart was going fast and irregular.  Has lost 50 pounds since March.  Has not had her thyroid recently checked.  No bleeding.    Past Medical History:  Diagnosis Date   Allergy    Anxiety    GERD (gastroesophageal reflux disease)    MDD (major depressive disorder) 01/24/2020   Pre-diabetes     Home Medications Prior to Admission medications   Medication Sig Start Date End Date Taking? Authorizing Provider  hydrOXYzine (ATARAX) 25 MG tablet Take 1 tablet (25 mg total) by mouth every 8 (eight) hours as needed for anxiety (anxiety/agitation or CIWA < or = 10). 05/03/22  Yes Shade Flood, MD  Semaglutide, 1 MG/DOSE, 4 MG/3ML SOPN Inject 1 mg as directed once a week. 05/03/22  Yes Hudnell, Judeth Cornfield, NP  ondansetron (ZOFRAN) 4 MG tablet Take 1-2 tablets (4-8 mg total) by mouth every 8 (eight) hours as needed for nausea or vomiting. Patient not taking: Reported on 06/22/2022 04/06/22   Dulce Sellar, NP      Allergies    Patient has no known allergies.    Review of Systems   Review of Systems  Cardiovascular:  Positive for palpitations.    Physical Exam Updated Vital Signs BP 128/80   Pulse 67   Temp 98 F (36.7 C) (Oral)   Resp 18   Ht 5\' 3"  (1.6 m)   Wt 90.7 kg   LMP 06/18/2022   SpO2 100%   BMI 35.43 kg/m  Physical Exam Vitals reviewed.  Cardiovascular:     Rate and Rhythm: Regular rhythm.  Pulmonary:     Breath sounds: No wheezing or rhonchi.  Abdominal:     Tenderness: There is no abdominal  tenderness.  Musculoskeletal:     Cervical back: Neck supple.     Right lower leg: No edema.     Left lower leg: No edema.  Skin:    Capillary Refill: Capillary refill takes less than 2 seconds.  Neurological:     Mental Status: She is alert and oriented to person, place, and time.     ED Results / Procedures / Treatments   Labs (all labs ordered are listed, but only abnormal results are displayed) Labs Reviewed  CBC - Abnormal; Notable for the following components:      Result Value   Hemoglobin 11.4 (*)    MCV 77.9 (*)    MCH 24.2 (*)    Platelets 452 (*)    All other components within normal limits  HEPATIC FUNCTION PANEL - Abnormal; Notable for the following components:   Indirect Bilirubin 0.1 (*)    All other components within normal limits  BASIC METABOLIC PANEL  HCG, QUANTITATIVE, PREGNANCY  TSH  TROPONIN I (HIGH SENSITIVITY)    EKG EKG Interpretation  Date/Time:  Tuesday June 22 2022 08:23:04 EST Ventricular Rate:  78 PR Interval:  165 QRS Duration: 70 QT Interval:  378 QTC Calculation: 431 R Axis:  22 Text Interpretation: Sinus rhythm Ventricular premature complex Confirmed by Benjiman Core [phone]) on 06/22/2022 9:46:59 AM  Radiology DG Chest Port 1 View  Result Date: 06/22/2022 CLINICAL DATA:  Chest pain EXAM: PORTABLE CHEST 1 VIEW COMPARISON:  Chest x-ray dated July 13, 2018 FINDINGS: The heart size and mediastinal contours are within normal limits. Both lungs are clear. The visualized skeletal structures are unremarkable. IMPRESSION: No active disease. Electronically Signed   By: Allegra Lai M.D.   On: 06/22/2022 08:42    Procedures Procedures    Medications Ordered in ED Medications  sodium chloride 0.9 % bolus 1,000 mL (0 mLs Intravenous Stopped 06/22/22 1020)    ED Course/ Medical Decision Making/ A&P                           Medical Decision Making Amount and/or Complexity of Data Reviewed Labs: ordered. Radiology:  ordered.   Patient with shortness of breath.  Palpitations.  EKG done up on the floor since patient has never showed sinus rhythm with PVCs.  Will get basic blood work orthostatics cardiac monitoring.  Blood work reassuring.  Fluid bolus given in feeling better.  Heart rate improved.  Was mildly orthostatic.  Will discharge home to follow-up with PCP.  Having frequent PVCs but no other apparent arrhythmia.  TSH also reassuring.        Final Clinical Impression(s) / ED Diagnoses Final diagnoses:  Palpitations    Rx / DC Orders ED Discharge Orders     None         Benjiman Core, MD 06/22/22 1540

## 2022-06-22 NOTE — ED Triage Notes (Signed)
Pt c/o heart feels like it is racing and having central chest pain  Pt states she had an episode last week of her heart racing but no dizziness or chest pain

## 2022-06-23 ENCOUNTER — Encounter: Payer: Self-pay | Admitting: Family

## 2022-06-23 ENCOUNTER — Ambulatory Visit: Payer: 59 | Admitting: Family

## 2022-06-23 VITALS — BP 119/81 | HR 73 | Temp 97.6°F | Ht 63.0 in | Wt 204.4 lb

## 2022-06-23 DIAGNOSIS — R7303 Prediabetes: Secondary | ICD-10-CM | POA: Diagnosis not present

## 2022-06-23 DIAGNOSIS — F411 Generalized anxiety disorder: Secondary | ICD-10-CM | POA: Diagnosis not present

## 2022-06-23 DIAGNOSIS — I493 Ventricular premature depolarization: Secondary | ICD-10-CM

## 2022-06-23 DIAGNOSIS — Z6841 Body Mass Index (BMI) 40.0 and over, adult: Secondary | ICD-10-CM

## 2022-06-23 MED ORDER — HYDROXYZINE HCL 25 MG PO TABS: 25.0000 mg | ORAL_TABLET | Freq: Three times a day (TID) | ORAL | 2 refills | Status: DC | PRN

## 2022-06-23 MED ORDER — SEMAGLUTIDE (1 MG/DOSE) 4 MG/3ML ~~LOC~~ SOPN: 1.0000 mg | PEN_INJECTOR | SUBCUTANEOUS | 1 refills | Status: DC

## 2022-06-23 NOTE — Assessment & Plan Note (Addendum)
chronic, morbidly obese last A1C 6.5 - 01/2021 started on Ozempic, lost > 50lbs, up to 1mg  dose qweek f/u 3 mos fasting labs

## 2022-06-23 NOTE — Progress Notes (Signed)
Patient ID: Sonya Santiago, female    DOB: 1996-02-05, 26 y.o.   MRN: [id number]  Chief Complaint  Patient presents with   Prediabetes   Follow-up    ED follow up.     HPI: Abnormal weight gain: pt reports chronic hx of abnormal weight gain and tried to manage on her own thru diet and exercise. She also reports she has prediabetes and is motivated to get her weight down. She has been on Ozempic about 6 months now, and currently at 1mg  dose and has lost over 50lbs since making lifestyle modifications and starting medication. Reports occasional nausea, but the Zofran causes constipation, so not taking this right now.  Heart palpitations:  with rapid heart rate, pt reports sx first starting after having Covid last year, but they have been tolerable until the last few days. Denies any increased anxiety, going thru infertility counseling, but does not feel stressed about that. Denies any caffeine use. pt feels better when lying down, but felt really dizzy yesterday and went to ER, PVCs on EKG, but labs were normal. Pt would like a referral to understand better why this is happening and wondering if she has POTS.  Assessment & Plan:   Problem List Items Addressed This Visit       Other   Pre-diabetes    chronic, morbidly obese last A1C 6.5 - 01/2021 started on Ozempic, lost > 50lbs, up to 1mg  dose qweek f/u 3 mos fasting labs      Relevant Medications   Semaglutide, 1 MG/DOSE, 4 MG/3ML SOPN   Class 3 severe obesity due to excess calories without serious comorbidity with body mass index (BMI) of 45.0 to 49.9 in adult [facility])   Relevant Medications   Semaglutide, 1 MG/DOSE, 4 MG/3ML SOPN   Generalized anxiety disorder    chronic taking Hydroxyzine 25mg  prn and going to therapy recently having increased anxiety due to new heart palpitations sending refill f/u 6 mos or prn      Relevant Medications   hydrOXYzine (ATARAX) 25 MG tablet   Other Visit Diagnoses     Frequent PVCs    -   Primary pt seen in ER yesterday, labs wnl, ECG w/PVCs, has been ongoing problem since Covid + last year, but worsening sx over last week, pt requesting further workup.    Relevant Orders   Ambulatory referral to Cardiology    ubjective:    Outpatient Medications Prior to Visit  Medication Sig Dispense Refill   ondansetron (ZOFRAN) 4 MG tablet Take 1-2 tablets (4-8 mg total) by mouth every 8 (eight) hours as needed for nausea or vomiting. 20 tablet 0   hydrOXYzine (ATARAX) 25 MG tablet Take 1 tablet (25 mg total) by mouth every 8 (eight) hours as needed for anxiety (anxiety/agitation or CIWA < or = 10). 30 tablet 0   Semaglutide, 1 MG/DOSE, 4 MG/3ML SOPN Inject 1 mg as directed once a week. 3 mL 1   No facility-administered medications prior to visit.   Past Medical History:  Diagnosis Date   Allergy    Anxiety    GERD (gastroesophageal reflux disease)    MDD (major depressive disorder) 01/24/2020   OD (overdose of drug), intentional self-harm, initial encounter (HCC) 01/26/2020   Pre-diabetes    Past Surgical History:  Procedure Laterality Date   TONSILLECTOMY AND ADENOIDECTOMY     WISDOM TOOTH EXTRACTION     No Known Allergies    Objective:    Physical Exam Vitals and nursing  note reviewed.  Constitutional:      Appearance: Normal appearance. She is obese.  Cardiovascular:     Rate and Rhythm: Normal rate and regular rhythm.     Comments: extra beat noted on auscultation, but not frequent beats Pulmonary:     Effort: Pulmonary effort is normal.     Breath sounds: Normal breath sounds.  Musculoskeletal:        General: Normal range of motion.  Skin:    General: Skin is warm and dry.  Neurological:     Mental Status: She is alert.  Psychiatric:        Mood and Affect: Mood normal.        Behavior: Behavior normal.    BP 119/81 (BP Location: Left Arm, Patient Position: Sitting, Cuff Size: Large)   Pulse 73   Temp 97.6 F (36.4 C) (Temporal)   Ht 5\' 3"  (1.6 m)    Wt 204 lb 6 oz (92.7 kg)   LMP 06/18/2022   SpO2 100%   BMI 36.20 kg/m  Wt Readings from Last 3 Encounters:  06/23/22 204 lb 6 oz (92.7 kg)  06/22/22 200 lb (90.7 kg)  03/05/22 221 lb (100.2 kg)       03/07/22, NP

## 2022-06-23 NOTE — Assessment & Plan Note (Signed)
chronic taking Hydroxyzine 25mg  prn and going to therapy recently having increased anxiety due to new heart palpitations sending refill f/u 6 mos or prn

## 2022-07-08 ENCOUNTER — Ambulatory Visit: Payer: 59 | Attending: Internal Medicine | Admitting: Internal Medicine

## 2022-07-08 ENCOUNTER — Ambulatory Visit: Payer: 59 | Attending: Internal Medicine

## 2022-07-08 ENCOUNTER — Encounter: Payer: Self-pay | Admitting: Internal Medicine

## 2022-07-08 VITALS — BP 117/71 | HR 81 | Ht 63.0 in | Wt 201.6 lb

## 2022-07-08 DIAGNOSIS — R002 Palpitations: Secondary | ICD-10-CM

## 2022-07-08 DIAGNOSIS — N970 Female infertility associated with anovulation: Secondary | ICD-10-CM | POA: Diagnosis not present

## 2022-07-08 DIAGNOSIS — R079 Chest pain, unspecified: Secondary | ICD-10-CM

## 2022-07-08 DIAGNOSIS — R42 Dizziness and giddiness: Secondary | ICD-10-CM

## 2022-07-08 DIAGNOSIS — D649 Anemia, unspecified: Secondary | ICD-10-CM

## 2022-07-08 DIAGNOSIS — R0609 Other forms of dyspnea: Secondary | ICD-10-CM

## 2022-07-08 NOTE — Patient Instructions (Addendum)
Medication Instructions:   No changes  *If you need a refill on your cardiac medications before your next appointment, please call your pharmacy*   Lab Work: Not needed    Testing/Procedures: Will be mailed to you 3 to 7 days Your physician has recommended that you wear a holter monitor 14 ZIo. Holter monitors are medical devices that record the heart's electrical activity. Doctors most often use these monitors to diagnose arrhythmias. Arrhythmias are problems with the speed or rhythm of the heartbeat. The monitor is a small, portable device. You can wear one while you do your normal daily activities. This is usually used to diagnose what is causing palpitations/syncope (passing out).  And  Will be  schedule at [facility] has requested that you have an echocardiogram. Echocardiography is a painless test that uses sound waves to create images of your heart. It provides your doctor with information about the size and shape of your heart and how well your heart's chambers and valves are working. This procedure takes approximately one hour. There are no restrictions for this procedure. Please do NOT wear cologne, perfume, aftershave, or lotions (deodorant is allowed). Please arrive 15 minutes prior to your appointment time.   Follow-Up: At [facility], you and your health needs are our priority.  As part of our continuing mission to provide you with exceptional heart care, we have created designated Provider Care Teams.  These Care Teams include your primary Cardiologist (physician) and Advanced Practice Providers (APPs -  Physician Assistants and Nurse Practitioners) who all work together to provide you with the care you need, when you need it.     Your next appointment:   3 month(s)  The format for your next appointment:   In Person  Provider:   None    Other Instructions  ZIO XT- Long Term Monitor Instructions  Your physician has  requested you wear a ZIO patch monitor for 14 days.  This is a single patch monitor. [employer] supplies one patch monitor per enrollment. Additional stickers are not available. Please do not apply patch if you will be having a Nuclear Stress Test,  Echocardiogram, Cardiac CT, MRI, or Chest Xray during the period you would be wearing the  monitor. The patch cannot be worn during these tests. You cannot remove and re-apply the  ZIO XT patch monitor.  Your ZIO patch monitor will be mailed 3 day USPS to your address on file. It may take 3-5 days  to receive your monitor after you have been enrolled.  Once you have received your monitor, please review the enclosed instructions. Your monitor  has already been registered assigning a specific monitor serial # to you.  Billing and Patient Assistance Program Information  We have supplied [employer] with any of your insurance information on file for billing purposes. [employer] offers a sliding scale Patient Assistance Program for patients that do not have  insurance, or whose insurance does not completely cover the cost of the ZIO monitor.  You must apply for the Patient Assistance Program to qualify for this discounted rate.  To apply, please call [employer] at [phone], select option 4, select option 2, ask to apply for  Patient Assistance Program. Sonya Santiago will ask your household income, and how many people  are in your household. They will quote your out-of-pocket cost based on that information.  [employer] will also be able to set up a 56-month, interest-free payment plan if needed.  Applying the  monitor   Shave hair from upper left chest.  Hold abrader disc by orange tab. Rub abrader in 40 strokes over the upper left chest as  indicated in your monitor instructions.  Clean area with 4 enclosed alcohol pads. Let dry.  Apply patch as indicated in monitor instructions. Patch will be placed under collarbone on left  side of chest with arrow pointing  upward.  Rub patch adhesive wings for 2 minutes. Remove white label marked "1". Remove the white  label marked "2". Rub patch adhesive wings for 2 additional minutes.  While looking in a mirror, press and release button in center of patch. A small green light will  flash 3-4 times. This will be your only indicator that the monitor has been turned on.  Do not shower for the first 24 hours. You may shower after the first 24 hours.  Press the button if you feel a symptom. You will hear a small click. Record Date, Time and  Symptom in the Patient Logbook.  When you are ready to remove the patch, follow instructions on the last 2 pages of Patient  Logbook. Stick patch monitor onto the last page of Patient Logbook.  Place Patient Logbook in the blue and white box. Use locking tab on box and tape box closed  securely. The blue and white box has prepaid postage on it. Please place it in the mailbox as  soon as possible. Your physician should have your test results approximately 7 days after the  monitor has been mailed back to [facility].  Call [facility] Customer Care at [phone] if you have questions regarding  your ZIO XT patch monitor. Call them immediately if you see an orange light blinking on your  monitor.  If your monitor falls off in less than 4 days, contact our Monitor department at [phone].  If your monitor becomes loose or falls off after 4 days call [employer] at [phone] for  suggestions on securing your monitor

## 2022-07-08 NOTE — Progress Notes (Unsigned)
Enrolled patient for a 14 day Zio XT monitor to be mailed to patents home

## 2022-07-08 NOTE — Progress Notes (Signed)
Cardiology Office Note:    Date:  07/08/2022  ID:  Karisha Marlin, DOB Jul 21, 1995, MRN [id number]  PCP:  Dulce Sellar, NP  Cardiologist:  Parke Poisson, MD  Electrophysiologist:  None   Referring MD: Dulce Sellar, NP   Chief Complaint/Reason for Referral: Palpitations and frequent PVCs  History of Present Illness:    Sonya Santiago is a 26 y.o. female with a history of pre-diabetes, anxiety, GERD, major depressive disorder.  She was referred by Dulce Sellar, NP after a recent visit on 06/23/2022. She reported experiencing episodes of rapid palpitations that began after having Covid last year. She feels better when lying down.   She went to the ER the day prior, on 06/22/2022, for dizziness and palpitations, and shortness of breath. She reported intermittent chest pain and denied any fevers, chills, and coughs. She described her palpitations as being rapid and irregular. Her EKG from the ED   Her palpitations and shortness of breath pre-dated her having Covid in June of 2022, but after Covid it became significantly worse. She initially attributed her symptoms to being over 200 lbs and episodes of anxiety.   Recently, she noticed while at work that her lips had turned a bluish color. Her heart rate was in the 190s. Her EKG showed PVCs in the ED. Her blood pressure tends to not be affected. During her palpitation episodes she feels weak, dizzy, short of breath, and chest tightness. Episodes will often last about hours or all day and worsens when lying down.   She has stopped all alcohol consumption since it would worsen her palpitations and shortness of breath the next day. She also decreased her caffeine intake about a month ago and now only drinks a cup a day.   She plans to receive IVF around the end of January and beginning of February.   She has recently stopped taking ozempic and is now taking metformin.  She denies any major injuries, surgeries, or other  procedures. Her mother had thyroid cancer while pregnant with her and received treatments after giving birth. She has a family history of diabetes. Her maternal grandfather had history of heart disease, having heart attacks in his 44s. She tested negative for the cancer gene that her mother had. Her paternal grandfather passed away suddenly, he had a history of epilepsy.  She denies any or peripheral edema. No headaches, syncope, orthopnea, or PND.  Past Medical History:  Diagnosis Date   Allergy    Anxiety    GERD (gastroesophageal reflux disease)    MDD (major depressive disorder) 01/24/2020   OD (overdose of drug), intentional self-harm, initial encounter (HCC) 01/26/2020   Pre-diabetes     Past Surgical History:  Procedure Laterality Date   TONSILLECTOMY AND ADENOIDECTOMY     WISDOM TOOTH EXTRACTION      Current Medications: Current Meds  Medication Sig   hydrOXYzine (ATARAX) 25 MG tablet Take 1 tablet (25 mg total) by mouth every 8 (eight) hours as needed for anxiety (anxiety/agitation or CIWA < or = 10).   metFORMIN (GLUCOPHAGE) 500 MG tablet Take 500 mg by mouth 2 (two) times daily with a meal.   ondansetron (ZOFRAN) 4 MG tablet Take 1-2 tablets (4-8 mg total) by mouth every 8 (eight) hours as needed for nausea or vomiting.     Allergies:   Patient has no known allergies.   Social History   Tobacco Use   Smoking status: Former    Packs/day: 0.10    Types: Cigarettes  Start date: 08/13/2013    Quit date: 07/12/2014    Years since quitting: 7.9   Smokeless tobacco: Never  Vaping Use   Vaping Use: Former  Substance Use Topics   Alcohol use: Not Currently   Drug use: Not Currently    Types: Other-see comments     Family History: The patient's family history includes Alcoholism in her paternal grandfather; Asthma in her brother; Cancer in her maternal grandmother and mother; Cirrhosis in her paternal grandfather; Diabetes in her maternal grandfather and paternal  grandmother; Heart disease in her maternal grandfather.  ROS:   Please see the history of present illness.    (+) Palpitations (+) Shortness of breath  (+) Chest tightness (+) Dizziness All other systems reviewed and are negative.  EKGs/Labs/Other Studies Reviewed:    The following studies were reviewed today:  EKG:  EKG is personally reviewed. 07/08/2022: Sinus rhythm with sinus arrhythmia. Rate 68 bpm.  Chest Xray 06/22/2022: FINDINGS: The heart size and mediastinal contours are within normal limits. Both lungs are clear. The visualized skeletal structures are unremarkable.  Recent Labs: 06/22/2022: ALT 18; BUN 11; Creatinine, Ser 0.72; Hemoglobin 11.4; Platelets 452; Potassium 3.8; Sodium 138; TSH 2.450  Recent Lipid Panel    Component Value Date/Time   CHOL 206 (H) 01/29/2021 1230   CHOL 162 08/27/2013 1046   TRIG 296.0 (H) 01/29/2021 1230   HDL 46.30 01/29/2021 1230   HDL 49 08/27/2013 1046   CHOLHDL 4 01/29/2021 1230   VLDL 59.2 (H) 01/29/2021 1230   LDLCALC 148 (H) 01/25/2020 0638   LDLCALC 96 08/27/2013 1046   LDLDIRECT 144.0 01/29/2021 1230    Physical Exam:    VS:  BP 117/71   Pulse 81   Ht 5\' 3"  (1.6 m)   Wt 201 lb 9.6 oz (91.4 kg)   LMP 06/18/2022   SpO2 99%   BMI 35.71 kg/m     Wt Readings from Last 5 Encounters:  07/08/22 201 lb 9.6 oz (91.4 kg)  06/23/22 204 lb 6 oz (92.7 kg)  06/22/22 200 lb (90.7 kg)  03/05/22 221 lb (100.2 kg)  02/02/22 226 lb 8 oz (102.7 kg)    Constitutional: No acute distress Eyes: sclera non-icteric, normal conjunctiva and lids ENMT: normal dentition, moist mucous membranes Cardiovascular: regular rhythm, normal rate, no murmur. S1 and S2 normal. No jugular venous distention.  Respiratory: clear to auscultation bilaterally GI : normal bowel sounds, soft and nontender. No distention.   MSK: extremities warm, well perfused. No edema.  NEURO: grossly nonfocal exam, moves all extremities. PSYCH: alert and oriented x 3,  normal mood and affect.   ASSESSMENT:    1. Palpitations   2. Infertility, anovulation   3. Anemia, unspecified type   4. DOE (dyspnea on exertion)   5. Chest pain of uncertain etiology   6. Dizziness    PLAN:    Palpitations - Plan: EKG 12-Lead, ECHOCARDIOGRAM COMPLETE, LONG TERM MONITOR (3-14 DAYS)  Infertility, anovulation  Anemia, unspecified type  DOE (dyspnea on exertion)  Chest pain of uncertain etiology  Dizziness  -2 week monitor to screen for malignant arrhythmia, particularly in anticipation of IVF which may exacerbate symptoms due to hormonal effects. She does recall having some correlation with symptoms and menses.  -Schedule echocardiogram to screen for structural concerns in setting of symptoms.   Follow up in 3 months.  Weston BrassGayatri Leyani Gargus, MD, [facility] HospitalFACC [place]  [facility]   Shared Decision Making/Informed Consent:  Medication Adjustments/Labs and Tests Ordered: Current medicines are reviewed at length with the patient today.  Concerns regarding medicines are outlined above.   Orders Placed This Encounter  Procedures   LONG TERM MONITOR (3-14 DAYS)   EKG 12-Lead   ECHOCARDIOGRAM COMPLETE   No orders of the defined types were placed in this encounter.  Patient Instructions  Medication Instructions:   No changes  *If you need a refill on your cardiac medications before your next appointment, please call your pharmacy*   Lab Work: Not needed    Testing/Procedures: Will be mailed to you 3 to 7 days Your physician has recommended that you wear a holter monitor 14 ZIo. Holter monitors are medical devices that record the heart's electrical activity. Doctors most often use these monitors to diagnose arrhythmias. Arrhythmias are problems with the speed or rhythm of the heartbeat. The monitor is a small, portable device. You can wear one while you do your normal daily activities. This is usually used to diagnose what is causing  palpitations/syncope (passing out).  And  Will be  schedule at [facility]  [address] Your physician has requested that you have an echocardiogram. Echocardiography is a painless test that uses sound waves to create images of your heart. It provides your doctor with information about the size and shape of your heart and how well your heart's chambers and valves are working. This procedure takes approximately one hour. There are no restrictions for this procedure. Please do NOT wear cologne, perfume, aftershave, or lotions (deodorant is allowed). Please arrive 15 minutes prior to your appointment time.   Follow-Up: At [facility], you and your health needs are our priority.  As part of our continuing mission to provide you with exceptional heart care, we have created designated Provider Care Teams.  These Care Teams include your primary Cardiologist (physician) and Advanced Practice Providers (APPs -  Physician Assistants and Nurse Practitioners) who all work together to provide you with the care you need, when you need it.     Your next appointment:   3 month(s)  The format for your next appointment:   In Person  Provider:   None    Other Instructions  ZIO XT- Long Term Monitor Instructions  Your physician has requested you wear a ZIO patch monitor for 14 days.  This is a single patch monitor. [employer] supplies one patch monitor per enrollment. Additional stickers are not available. Please do not apply patch if you will be having a Nuclear Stress Test,  Echocardiogram, Cardiac CT, MRI, or Chest Xray during the period you would be wearing the  monitor. The patch cannot be worn during these tests. You cannot remove and re-apply the  ZIO XT patch monitor.  Your ZIO patch monitor will be mailed 3 day USPS to your address on file. It may take 3-5 days  to receive your monitor after you have been enrolled.  Once you have received your monitor, please review the enclosed  instructions. Your monitor  has already been registered assigning a specific monitor serial # to you.  Billing and Patient Assistance Program Information  We have supplied [employer] with any of your insurance information on file for billing purposes. [employer] offers a sliding scale Patient Assistance Program for patients that do not have  insurance, or whose insurance does not completely cover the cost of the ZIO monitor.  You must apply for the Patient Assistance Program to qualify for this discounted rate.  To apply,  please call [employer] at [phone], select option 4, select option 2, ask to apply for  Patient Assistance Program. Meredeth Ide will ask your household income, and how many people  are in your household. They will quote your out-of-pocket cost based on that information.  [employer] will also be able to set up a 38-month, interest-free payment plan if needed.  Applying the monitor   Shave hair from upper left chest.  Hold abrader disc by orange tab. Rub abrader in 40 strokes over the upper left chest as  indicated in your monitor instructions.  Clean area with 4 enclosed alcohol pads. Let dry.  Apply patch as indicated in monitor instructions. Patch will be placed under collarbone on left  side of chest with arrow pointing upward.  Rub patch adhesive wings for 2 minutes. Remove white label marked "1". Remove the white  label marked "2". Rub patch adhesive wings for 2 additional minutes.  While looking in a mirror, press and release button in center of patch. A small green light will  flash 3-4 times. This will be your only indicator that the monitor has been turned on.  Do not shower for the first 24 hours. You may shower after the first 24 hours.  Press the button if you feel a symptom. You will hear a small click. Record Date, Time and  Symptom in the Patient Logbook.  When you are ready to remove the patch, follow instructions on the last 2 pages of Patient  Logbook. Stick patch  monitor onto the last page of Patient Logbook.  Place Patient Logbook in the blue and white box. Use locking tab on box and tape box closed  securely. The blue and white box has prepaid postage on it. Please place it in the mailbox as  soon as possible. Your physician should have your test results approximately 7 days after the  monitor has been mailed back to [facility].  Call [facility] Customer Care at [phone] if you have questions regarding  your ZIO XT patch monitor. Call them immediately if you see an orange light blinking on your  monitor.  If your monitor falls off in less than 4 days, contact our Monitor department at [phone].  If your monitor becomes loose or falls off after 4 days call [employer] at [phone] for  suggestions on securing your monitor    I,Rachel Rivera,acting as a scribe for Parke Poisson, MD.,have documented all relevant documentation on the behalf of Parke Poisson, MD,as directed by  Parke Poisson, MD while in the presence of Parke Poisson, MD.  I, Parke Poisson, MD, have reviewed all documentation for the visit on 07/08/2022. The documentation on today's date of service for the exam, diagnosis, procedures, and orders are all accurate and complete.

## 2022-07-13 DIAGNOSIS — R002 Palpitations: Secondary | ICD-10-CM | POA: Diagnosis not present

## 2022-08-10 ENCOUNTER — Ambulatory Visit (HOSPITAL_COMMUNITY): Payer: 59 | Attending: Internal Medicine

## 2022-08-10 DIAGNOSIS — R0602 Shortness of breath: Secondary | ICD-10-CM | POA: Diagnosis not present

## 2022-08-10 DIAGNOSIS — R002 Palpitations: Secondary | ICD-10-CM | POA: Diagnosis not present

## 2022-08-10 LAB — ECHOCARDIOGRAM COMPLETE
Area-P 1/2: 3.46 cm2
S' Lateral: 2.9 cm

## 2022-09-24 ENCOUNTER — Telehealth: Payer: Self-pay | Admitting: *Deleted

## 2022-09-24 MED ORDER — METOPROLOL TARTRATE 25 MG PO TABS: 12.5000 mg | ORAL_TABLET | Freq: Two times a day (BID) | ORAL | 3 refills | Status: DC | PRN

## 2022-09-24 NOTE — Telephone Encounter (Signed)
Patient aware rx sent to pharmacy.  

## 2022-09-24 NOTE — Telephone Encounter (Signed)
Patient contacted office with complaints of increased episodes of elevated HR and dizziness.  Patient reports being well hydrated.  Patient reports episodes of HR in 120-130s, resting HR in the 80s.  Denies SOB/ chest pain.  Per monitor report:   Elouise Munroe, MD 08/10/2022  4:03 PM EST     Her symptoms correspond to ventricular ectopy (PVCs) occasionally in bigeminy. There was one brief episode of nonsustained VT (only 5 beats). Overall, this is likely low risk. I think we can observe this for now. Given upcoming IVF plan, no medications are required. Happy to discuss further at follow up as needed. Can consider prn metoprolol or metoprolol starting in the second trimester if she becomes pregnant and if very symptomatic and wanting treatment.    Patient is scheduled for possible IVF transfer on 3/18.  Patient would like to know if she can start metoprolol and/or if considered safe with upcoming transfer.    Will send message to MD to review.

## 2022-10-04 ENCOUNTER — Ambulatory Visit: Payer: 59 | Admitting: Internal Medicine

## 2022-11-11 ENCOUNTER — Ambulatory Visit: Payer: 59 | Admitting: Family

## 2022-11-11 ENCOUNTER — Inpatient Hospital Stay (HOSPITAL_COMMUNITY): Payer: 59

## 2022-11-11 ENCOUNTER — Encounter (HOSPITAL_COMMUNITY): Payer: Self-pay | Admitting: Obstetrics and Gynecology

## 2022-11-11 ENCOUNTER — Encounter: Payer: Self-pay | Admitting: Family

## 2022-11-11 ENCOUNTER — Inpatient Hospital Stay (HOSPITAL_COMMUNITY)
Admission: AD | Admit: 2022-11-11 | Discharge: 2022-11-12 | Disposition: A | Discharge status: Home / Self Care | Payer: 59 | Attending: Obstetrics and Gynecology | Admitting: Obstetrics and Gynecology

## 2022-11-11 VITALS — BP 125/80 | HR 85 | Temp 97.1°F | Ht 63.0 in | Wt 213.2 lb

## 2022-11-11 DIAGNOSIS — O468X9 Other antepartum hemorrhage, unspecified trimester: Secondary | ICD-10-CM | POA: Diagnosis present

## 2022-11-11 DIAGNOSIS — O26891 Other specified pregnancy related conditions, first trimester: Secondary | ICD-10-CM | POA: Diagnosis present

## 2022-11-11 DIAGNOSIS — Z3A08 8 weeks gestation of pregnancy: Secondary | ICD-10-CM | POA: Diagnosis not present

## 2022-11-11 DIAGNOSIS — Z3A01 Less than 8 weeks gestation of pregnancy: Secondary | ICD-10-CM | POA: Diagnosis not present

## 2022-11-11 DIAGNOSIS — R102 Pelvic and perineal pain: Secondary | ICD-10-CM | POA: Diagnosis present

## 2022-11-11 DIAGNOSIS — R300 Dysuria: Secondary | ICD-10-CM

## 2022-11-11 DIAGNOSIS — O208 Other hemorrhage in early pregnancy: Secondary | ICD-10-CM | POA: Insufficient documentation

## 2022-11-11 DIAGNOSIS — O09811 Supervision of pregnancy resulting from assisted reproductive technology, first trimester: Secondary | ICD-10-CM | POA: Diagnosis not present

## 2022-11-11 DIAGNOSIS — Z789 Other specified health status: Secondary | ICD-10-CM | POA: Diagnosis not present

## 2022-11-11 LAB — POCT URINALYSIS DIPSTICK
Bilirubin, UA: NEGATIVE
Blood, UA: POSITIVE
Glucose, UA: NEGATIVE
Ketones, UA: NEGATIVE
Nitrite, UA: NEGATIVE
Protein, UA: POSITIVE — AB
Spec Grav, UA: 1.015 (ref 1.010–1.025)
Urobilinogen, UA: 0.2 E.U./dL
pH, UA: 7 (ref 5.0–8.0)

## 2022-11-11 NOTE — Progress Notes (Signed)
   Patient ID: Sonya Santiago, female    DOB: September 22, 1995, 27 y.o.   MRN: [id number]  Chief Complaint  Patient presents with   Dysuria    Pt c/o dysuria and bladder pain, Present Since Monday. Pt states she is [redacted] weeks pregnant after IVF.     HPI:      Dysuria:  Pt c/o dysuria and bladder pain, Present Since Monday. Pt states she is [redacted] weeks pregnant after IVF.       Assessment & Plan:  1. Dysuria - UA positive for trace leuks, sending out for culture, pt reports mild discomfort with her sx, advised better to wait and see if culture positive before treating. Pt agreeable. Advised on increasing water intake to at least 2L qd.  - POCT Urinalysis Dipstick - Urine Culture  Subjective:    Outpatient Medications Prior to Visit  Medication Sig Dispense Refill   metFORMIN (GLUCOPHAGE) 500 MG tablet Take 500 mg by mouth 2 (two) times daily with a meal.     metoprolol tartrate (LOPRESSOR) 25 MG tablet Take 0.5 tablets (12.5 mg total) by mouth 2 (two) times daily as needed. 30 tablet 3   hydrOXYzine (ATARAX) 25 MG tablet Take 1 tablet (25 mg total) by mouth every 8 (eight) hours as needed for anxiety (anxiety/agitation or CIWA < or = 10). (Patient not taking: Reported on 11/11/2022) 30 tablet 2   ondansetron (ZOFRAN) 4 MG tablet Take 1-2 tablets (4-8 mg total) by mouth every 8 (eight) hours as needed for nausea or vomiting. (Patient not taking: Reported on 11/11/2022) 20 tablet 0   Semaglutide, 1 MG/DOSE, 4 MG/3ML SOPN Inject 1 mg as directed once a week. (Patient not taking: Reported on 11/11/2022) 3 mL 1   No facility-administered medications prior to visit.   Past Medical History:  Diagnosis Date   Allergy    Anxiety    GERD (gastroesophageal reflux disease)    MDD (major depressive disorder) 01/24/2020   OD (overdose of drug), intentional self-harm, initial encounter 01/26/2020   Pre-diabetes    Past Surgical History:  Procedure Laterality Date   TONSILLECTOMY AND ADENOIDECTOMY     WISDOM  TOOTH EXTRACTION     No Known Allergies    Objective:    Physical Exam Vitals and nursing note reviewed.  Constitutional:      Appearance: Normal appearance.  Cardiovascular:     Rate and Rhythm: Normal rate and regular rhythm.  Pulmonary:     Effort: Pulmonary effort is normal.     Breath sounds: Normal breath sounds.  Musculoskeletal:        General: Normal range of motion.  Skin:    General: Skin is warm and dry.  Neurological:     Mental Status: She is alert.  Psychiatric:        Mood and Affect: Mood normal.        Behavior: Behavior normal.    There were no vitals taken for this visit. Wt Readings from Last 3 Encounters:  07/08/22 201 lb 9.6 oz (91.4 kg)  06/23/22 204 lb 6 oz (92.7 kg)  06/22/22 200 lb (90.7 kg)       Dulce Sellar, NP

## 2022-11-11 NOTE — MAU Provider Note (Signed)
Chief Complaint: Abdominal Pain   Event Date/Time   First Provider Initiated Contact with Patient 11/11/22 2330        SUBJECTIVE HPI: Sonya Santiago is a 27 y.o. G1P0000 at  7+ weeks by US/IVF who presents to maternity admissions reporting pelvic pain and pressure since Monday.  Had IVF (for female factor) and has had one [place] at their office confirming a 6 week pregnancy.  . She denies vaginal bleeding, vaginal itching/burning, urinary symptoms, h/a, dizziness, n/v, or fever/chills.    Abdominal Pain This is a new problem. The current episode started in the past 7 days. The onset quality is gradual. The problem occurs intermittently. The problem has been unchanged. The quality of the pain is cramping. The abdominal pain does not radiate. Pertinent negatives include no constipation, diarrhea, dysuria, fever, frequency, nausea or vomiting. Nothing aggravates the pain. The pain is relieved by Nothing. She has tried nothing for the symptoms.   RN Note: Sonya Santiago is a 27 y.o. at Unknown here in MAU reporting: since Monday hs had pelvic pain and pressure.  Feels like her cervix is low.Had IVF done lat month. [facility] 06/25/23. Denies any vag bleeding .reprots a clear dischrge that is not new.  LMP: Onset of complaint: Monday Pain score: 5  Past Medical History:  Diagnosis Date   Allergy    Anxiety    GERD (gastroesophageal reflux disease)    MDD (major depressive disorder) 01/24/2020   OD (overdose of drug), intentional self-harm, initial encounter (HCC) 01/26/2020   Pre-diabetes    Past Surgical History:  Procedure Laterality Date   TONSILLECTOMY AND ADENOIDECTOMY     WISDOM TOOTH EXTRACTION     Social History   Socioeconomic History   Marital status: Married    Spouse name: Not on file   Number of children: 0   Years of education: Not on file   Highest education level: Associate degree: occupational, Scientist, product/process development, or vocational program  Occupational History   Not on file  Tobacco Use    Smoking status: Former    Packs/day: .1    Types: Cigarettes    Start date: 08/13/2013    Quit date: 07/12/2014    Years since quitting: 8.3   Smokeless tobacco: Never  Vaping Use   Vaping Use: Former  Substance and Sexual Activity   Alcohol use: Not Currently   Drug use: Not Currently    Types: Other-see comments   Sexual activity: Not Currently    Birth control/protection: None  Other Topics Concern   Not on file  Social History Narrative   Not on file   Social Determinants of Health   Financial Resource Strain: Low Risk  (11/10/2022)   Overall Financial Resource Strain (CARDIA)    Difficulty of Paying Living Expenses: Not hard at all  Food Insecurity: No Food Insecurity (11/10/2022)   Hunger Vital Sign    Worried About Running Out of Food in the Last Year: Never true    Ran Out of Food in the Last Year: Never true  Transportation Needs: No Transportation Needs (11/10/2022)   PRAPARE - Administrator, Civil Service (Medical): No    Lack of Transportation (Non-Medical): No  Physical Activity: Sufficiently Active (11/10/2022)   Exercise Vital Sign    Days of Exercise per Week: 5 days    Minutes of Exercise per Session: 30 min  Stress: No Stress Concern Present (11/10/2022)   [employer] of Occupational Health - Occupational Stress Questionnaire  Feeling of Stress : Only a little  Social Connections: Moderately Integrated (11/10/2022)   Social Connection and Isolation Panel [NHANES]    Frequency of Communication with Friends and Family: More than three times a week    Frequency of Social Gatherings with Friends and Family: Twice a week    Attends Religious Services: More than 4 times per year    Active Member of [employer] or Organizations: No    Attends Engineer, structural: Not on file    Marital Status: Married  Catering manager Violence: Not on file   No current facility-administered medications on file prior to encounter.   Current Outpatient  Medications on File Prior to Encounter  Medication Sig Dispense Refill   metFORMIN (GLUCOPHAGE) 500 MG tablet Take 500 mg by mouth 2 (two) times daily with a meal.     hydrOXYzine (ATARAX) 25 MG tablet Take 1 tablet (25 mg total) by mouth every 8 (eight) hours as needed for anxiety (anxiety/agitation or CIWA < or = 10). (Patient not taking: Reported on 11/11/2022) 30 tablet 2   metoprolol tartrate (LOPRESSOR) 25 MG tablet Take 0.5 tablets (12.5 mg total) by mouth 2 (two) times daily as needed. 30 tablet 3   ondansetron (ZOFRAN) 4 MG tablet Take 1-2 tablets (4-8 mg total) by mouth every 8 (eight) hours as needed for nausea or vomiting. (Patient not taking: Reported on 11/11/2022) 20 tablet 0   Semaglutide, 1 MG/DOSE, 4 MG/3ML SOPN Inject 1 mg as directed once a week. (Patient not taking: Reported on 11/11/2022) 3 mL 1   No Known Allergies  I have reviewed patient's Past Medical Hx, Surgical Hx, Family Hx, Social Hx, medications and allergies.   ROS:  Review of Systems  Constitutional:  Negative for fever.  Gastrointestinal:  Positive for abdominal pain. Negative for constipation, diarrhea, nausea and vomiting.  Genitourinary:  Negative for dysuria and frequency.   Review of Systems  Other systems negative   Physical Exam  Physical Exam Patient Vitals for the past 24 hrs:  BP Temp Pulse Resp SpO2 Height Weight  11/11/22 2305 120/68 -- 96 -- 100 % -- --  11/11/22 2255 139/84 98.8 F (37.1 C) 96 18 -- 5\' 3"  (1.6 m) 97.5 kg   Constitutional: Well-developed, well-nourished female in no acute distress.  Cardiovascular: normal rate Respiratory: normal effort GI: Abd soft, non-tender.  MS: Extremities nontender, no edema, normal ROM Neurologic: Alert and oriented x 4.  GU: Neg CVAT.  PELVIC EXAM: Deferred in lieu of ultrasound examination  LAB RESULTS Results for orders placed or performed in visit on 11/11/22 (from the past 24 hour(s))  POCT Urinalysis Dipstick     Status: Abnormal    Collection Time: 11/11/22  2:36 PM  Result Value Ref Range   Color, UA yellow    Clarity, UA clear    Glucose, UA Negative Negative   Bilirubin, UA negative    Ketones, UA negative    Spec Grav, UA 1.015 1.010 - 1.025   Blood, UA positive    pH, UA 7.0 5.0 - 8.0   Protein, UA Positive (A) Negative   Urobilinogen, UA 0.2 0.2 or 1.0 E.U./dL   Nitrite, UA negative    Leukocytes, UA Trace (A) Negative   Appearance     Odor         IMAGING US OB LESS THAN 14 WEEKS WITH OB TRANSVAGINAL  Result Date: 11/12/2022 CLINICAL DATA:  Pelvic pressure EXAM: OBSTETRIC <14 WK [place] AND TRANSVAGINAL OB US  TECHNIQUE: Both transabdominal and transvaginal ultrasound examinations were performed for complete evaluation of the gestation as well as the maternal uterus, adnexal regions, and pelvic cul-de-sac. Transvaginal technique was performed to assess early pregnancy. COMPARISON:  03/29/2018 FINDINGS: Intrauterine gestational sac: Single Yolk sac:  Visualized. Embryo:  Visualized. Cardiac Activity: Visualized. Heart Rate: 160 bpm MSD:   mm    w     d CRL:  15.3 mm   7 w   6 d                  [place] EDC: 06/24/2023 Subchorionic hemorrhage:  Moderate subchorionic hemorrhage Maternal uterus/adnexae: No adnexal mass or free fluid. IMPRESSION: Seven week 6 day intrauterine pregnancy. Fetal heart rate 160 beats per minute. Moderate subchorionic hemorrhage. Electronically Signed   By: Charlett Nose M.D.   On: 11/12/2022 00:11     MAU Management/MDM: I have reviewed the triage vital signs and the nursing notes.   Pertinent labs & imaging results that were available during my care of the patient were reviewed by me and considered in my medical decision making (see chart for details).      I have reviewed her medical records including past results, notes and treatments. Medical, Surgical, and family history were reviewed.  Medications and recent lab tests were reviewed  Ordered Ultrasound to rule out spontaneous or  threatened abortion  Ultrasound findings reviewed They show a live single fetus at [redacted]w[redacted]d  Normal appearance Moderate subchorionic hemorrhage seen  ASSESSMENT Pelvic pain in early pregnancy Single IUP at [redacted]w[redacted]d Moderate subchorionic hemorrhage  PLAN Discharge home Followup with OB as scheduled Bleeding precautions  Pt stable at time of discharge. Encouraged to return here if she develops worsening of symptoms, increase in pain, fever, or other concerning symptoms.    Wynelle Bourgeois CNM, MSN Certified Nurse-Midwife 11/11/2022  11:37 PM

## 2022-11-11 NOTE — MAU Note (Signed)
.  Sonya Santiago is a 27 y.o. at Unknown here in MAU reporting: since Monday hs had pelvic pain and pressure.  Feels like her cervix is low.Had IVF done lat month. [facility] 06/25/23. Denies any vag bleeding .reprots a clear dischrge that is not new.  LMP: Onset of complaint: Monday Pain score: 5 Vitals:   11/11/22 2255  BP: 139/84  Pulse: 96  Resp: 18  Temp: 98.8 F (37.1 C)     FHT: Lab orders placed from triage:

## 2022-11-12 ENCOUNTER — Encounter: Payer: Self-pay | Admitting: Advanced Practice Midwife

## 2022-11-12 DIAGNOSIS — R102 Pelvic and perineal pain: Secondary | ICD-10-CM

## 2022-11-12 DIAGNOSIS — Z789 Other specified health status: Secondary | ICD-10-CM | POA: Diagnosis present

## 2022-11-12 DIAGNOSIS — O208 Other hemorrhage in early pregnancy: Secondary | ICD-10-CM

## 2022-11-12 DIAGNOSIS — O26891 Other specified pregnancy related conditions, first trimester: Secondary | ICD-10-CM

## 2022-11-12 DIAGNOSIS — Z3A01 Less than 8 weeks gestation of pregnancy: Secondary | ICD-10-CM

## 2022-11-12 DIAGNOSIS — O468X9 Other antepartum hemorrhage, unspecified trimester: Secondary | ICD-10-CM | POA: Diagnosis present

## 2022-11-12 LAB — URINE CULTURE
MICRO NUMBER:: 14873989
SPECIMEN QUALITY:: ADEQUATE

## 2022-11-16 NOTE — Progress Notes (Deleted)
Cardiology Office Note:    Date:  11/17/2022  ID:  Sonya Santiago, DOB 1996-07-17, MRN [id number]  PCP:  Dulce Sellar, NP  Cardiologist:  Parke Poisson, MD  Electrophysiologist:  None   Referring MD: Dulce Sellar, NP   Chief Complaint/Reason for Referral: Palpitations and frequent PVCs  History of Present Illness:    Sonya Santiago is a 27 y.o. female with a history of pre-diabetes, anxiety, GERD, major depressive disorder.  Today: ***  Last visit:  She was referred by Dulce Sellar, NP after a recent visit on 06/23/2022. She reported experiencing episodes of rapid palpitations that began after having Covid last year. She feels better when lying down.   She went to the ER the day prior, on 06/22/2022, for dizziness and palpitations, and shortness of breath. She reported intermittent chest pain and denied any fevers, chills, and coughs. She described her palpitations as being rapid and irregular. Her EKG from the ED   Her palpitations and shortness of breath pre-dated her having Covid in June of 2022, but after Covid it became significantly worse. She initially attributed her symptoms to being over 200 lbs and episodes of anxiety.   Recently, she noticed while at work that her lips had turned a bluish color. Her heart rate was in the 190s. Her EKG showed PVCs in the ED. Her blood pressure tends to not be affected. During her palpitation episodes she feels weak, dizzy, short of breath, and chest tightness. Episodes will often last about hours or all day and worsens when lying down.   She has stopped all alcohol consumption since it would worsen her palpitations and shortness of breath the next day. She also decreased her caffeine intake about a month ago and now only drinks a cup a day.   She plans to receive IVF around the end of January and beginning of February.   She has recently stopped taking ozempic and is now taking metformin.  She denies any major injuries,  surgeries, or other procedures. Her mother had thyroid cancer while pregnant with her and received treatments after giving birth. She has a family history of diabetes. Her maternal grandfather had history of heart disease, having heart attacks in his 63s. She tested negative for the cancer gene that her mother had. Her paternal grandfather passed away suddenly, he had a history of epilepsy.  She denies any or peripheral edema. No headaches, syncope, orthopnea, or PND.  Past Medical History:  Diagnosis Date   Allergy    Anxiety    GERD (gastroesophageal reflux disease)    MDD (major depressive disorder) 01/24/2020   OD (overdose of drug), intentional self-harm, initial encounter (HCC) 01/26/2020   Pre-diabetes     Past Surgical History:  Procedure Laterality Date   TONSILLECTOMY AND ADENOIDECTOMY     WISDOM TOOTH EXTRACTION      Current Medications: No outpatient medications have been marked as taking for the 11/17/22 encounter (Appointment) with Parke Poisson, MD.     Allergies:   Patient has no known allergies.   Social History   Tobacco Use   Smoking status: Former    Packs/day: .1    Types: Cigarettes    Start date: 08/13/2013    Quit date: 07/12/2014    Years since quitting: 8.3   Smokeless tobacco: Never  Vaping Use   Vaping Use: Former  Substance Use Topics   Alcohol use: Not Currently   Drug use: Not Currently    Types: Other-see comments  Family History: The patient's family history includes Alcoholism in her paternal grandfather; Asthma in her brother; Cancer in her maternal grandmother and mother; Cirrhosis in her paternal grandfather; Diabetes in her maternal grandfather and paternal grandmother; Heart disease in her maternal grandfather.  ROS:   Please see the history of present illness.    (+) Palpitations (+) Shortness of breath  (+) Chest tightness (+) Dizziness All other systems reviewed and are negative.  EKGs/Labs/Other Studies Reviewed:     The following studies were reviewed today:  EKG:  EKG is personally reviewed. 07/08/2022: Sinus rhythm with sinus arrhythmia. Rate 68 bpm.  Chest Xray 06/22/2022: FINDINGS: The heart size and mediastinal contours are within normal limits. Both lungs are clear. The visualized skeletal structures are unremarkable.  Recent Labs: 06/22/2022: ALT 18; BUN 11; Creatinine, Ser 0.72; Hemoglobin 11.4; Platelets 452; Potassium 3.8; Sodium 138; TSH 2.450  Recent Lipid Panel    Component Value Date/Time   CHOL 206 (H) 01/29/2021 1230   CHOL 162 08/27/2013 1046   TRIG 296.0 (H) 01/29/2021 1230   HDL 46.30 01/29/2021 1230   HDL 49 08/27/2013 1046   CHOLHDL 4 01/29/2021 1230   VLDL 59.2 (H) 01/29/2021 1230   LDLCALC 148 (H) 01/25/2020 0638   LDLCALC 96 08/27/2013 1046   LDLDIRECT 144.0 01/29/2021 1230    Physical Exam:    VS:  LMP 06/18/2022     Wt Readings from Last 5 Encounters:  11/11/22 215 lb (97.5 kg)  11/11/22 213 lb 3.2 oz (96.7 kg)  07/08/22 201 lb 9.6 oz (91.4 kg)  06/23/22 204 lb 6 oz (92.7 kg)  06/22/22 200 lb (90.7 kg)    Constitutional: No acute distress Eyes: sclera non-icteric, normal conjunctiva and lids ENMT: normal dentition, moist mucous membranes Cardiovascular: regular rhythm, normal rate, no murmur. S1 and S2 normal. No jugular venous distention.  Respiratory: clear to auscultation bilaterally GI : normal bowel sounds, soft and nontender. No distention.   MSK: extremities warm, well perfused. No edema.  NEURO: grossly nonfocal exam, moves all extremities. PSYCH: alert and oriented x 3, normal mood and affect.   ASSESSMENT:    No diagnosis found.  PLAN:    No diagnosis found.  -2 week monitor to screen for malignant arrhythmia, particularly in anticipation of IVF which may exacerbate symptoms due to hormonal effects. She does recall having some correlation with symptoms and menses.  -Schedule echocardiogram to screen for structural concerns in setting  of symptoms.   Follow up in 3 months.  Weston Brass, MD, [facility]  [facility]   Shared Decision Making/Informed Consent:       Medication Adjustments/Labs and Tests Ordered: Current medicines are reviewed at length with the patient today.  Concerns regarding medicines are outlined above.   No orders of the defined types were placed in this encounter.  No orders of the defined types were placed in this encounter.  There are no Patient Instructions on file for this visit.

## 2022-11-17 ENCOUNTER — Ambulatory Visit: Payer: 59 | Admitting: Internal Medicine

## 2022-12-03 ENCOUNTER — Encounter (HOSPITAL_COMMUNITY): Payer: Self-pay | Admitting: Obstetrics and Gynecology

## 2022-12-03 ENCOUNTER — Inpatient Hospital Stay (HOSPITAL_COMMUNITY)
Admission: AD | Admit: 2022-12-03 | Discharge: 2022-12-03 | Disposition: A | Discharge status: Home / Self Care | Payer: 59 | Attending: Obstetrics and Gynecology | Admitting: Obstetrics and Gynecology

## 2022-12-03 DIAGNOSIS — R112 Nausea with vomiting, unspecified: Secondary | ICD-10-CM

## 2022-12-03 DIAGNOSIS — O219 Vomiting of pregnancy, unspecified: Secondary | ICD-10-CM | POA: Insufficient documentation

## 2022-12-03 DIAGNOSIS — Z3A11 11 weeks gestation of pregnancy: Secondary | ICD-10-CM | POA: Diagnosis not present

## 2022-12-03 LAB — URINALYSIS, ROUTINE W REFLEX MICROSCOPIC
Bilirubin Urine: NEGATIVE
Glucose, UA: NEGATIVE mg/dL
Hgb urine dipstick: NEGATIVE
Ketones, ur: NEGATIVE mg/dL
Leukocytes,Ua: NEGATIVE
Nitrite: NEGATIVE
Protein, ur: NEGATIVE mg/dL
Specific Gravity, Urine: 1.014 (ref 1.005–1.030)
pH: 5 (ref 5.0–8.0)

## 2022-12-03 MED ORDER — SODIUM CHLORIDE 0.9 % IV SOLN
8.0000 mg | Freq: Once | INTRAVENOUS | Status: AC
  Administered 2022-12-03: 8 mg via INTRAVENOUS
  Filled 2022-12-03: qty 4

## 2022-12-03 MED ORDER — ONDANSETRON HCL 4 MG PO TABS: 4.0000 mg | ORAL_TABLET | Freq: Three times a day (TID) | ORAL | 0 refills | Status: DC | PRN

## 2022-12-03 MED ORDER — LACTATED RINGERS IV BOLUS
1000.0000 mL | Freq: Once | INTRAVENOUS | Status: AC
  Administered 2022-12-03: 1000 mL via INTRAVENOUS

## 2022-12-03 NOTE — MAU Provider Note (Signed)
History     CSN: [id number]  Arrival date and time: 12/03/22 1204   Event Date/Time   First Provider Initiated Contact with Patient 12/03/22 1331      Chief Complaint  Patient presents with   Nausea   HPI  [name] is a 27 y.o. female G1P0000 @ [redacted]w[redacted]d IVF pregnancy here with nausea and vomiting. She Has vomited a few times since Tuesday. She reports not keeping much fluid down. She has not tried anything over the counter or prescription.   OB History     Gravida  1   Para  0   Term  0   Preterm  0   AB  0   Living  0      SAB  0   IAB  0   Ectopic  0   Multiple  0   Live Births  0           Past Medical History:  Diagnosis Date   Allergy    Anxiety    GERD (gastroesophageal reflux disease)    MDD (major depressive disorder) 01/24/2020   OD (overdose of drug), intentional self-harm, initial encounter (HCC) 01/26/2020   Pre-diabetes     Past Surgical History:  Procedure Laterality Date   TONSILLECTOMY AND ADENOIDECTOMY     WISDOM TOOTH EXTRACTION      Family History  Problem Relation Age of Onset   Cancer Mother        Breast, Thyroid   Asthma Brother    Cancer Maternal Grandmother    Diabetes Maternal Grandfather    Heart disease Maternal Grandfather    Diabetes Paternal Grandmother    Cirrhosis Paternal Grandfather    Alcoholism Paternal Grandfather     Social History   Tobacco Use   Smoking status: Former    Packs/day: .1    Types: Cigarettes    Start date: 08/13/2013    Quit date: 07/12/2014    Years since quitting: 8.4   Smokeless tobacco: Never  Vaping Use   Vaping Use: Former  Substance Use Topics   Alcohol use: Not Currently   Drug use: Not Currently    Types: Other-see comments    Allergies: No Known Allergies  Medications Prior to Admission  Medication Sig Dispense Refill Last Dose   aspirin EC 81 MG tablet Take 81 mg by mouth daily. Swallow whole.   12/03/2022   metFORMIN (GLUCOPHAGE) 500 MG tablet Take  500 mg by mouth 2 (two) times daily with a meal.   12/03/2022   Prenatal Vit-Fe Fumarate-FA (PRENATAL MULTIVITAMIN) TABS tablet Take 1 tablet by mouth daily at 12 noon.      progesterone 200 MG SUPP Place 200 mg vaginally at bedtime.   12/02/2022   hydrOXYzine (ATARAX) 25 MG tablet Take 1 tablet (25 mg total) by mouth every 8 (eight) hours as needed for anxiety (anxiety/agitation or CIWA < or = 10). (Patient not taking: Reported on 11/11/2022) 30 tablet 2    metoprolol tartrate (LOPRESSOR) 25 MG tablet Take 0.5 tablets (12.5 mg total) by mouth 2 (two) times daily as needed. 30 tablet 3    ondansetron (ZOFRAN) 4 MG tablet Take 1-2 tablets (4-8 mg total) by mouth every 8 (eight) hours as needed for nausea or vomiting. (Patient not taking: Reported on 11/11/2022) 20 tablet 0    Results for orders placed or performed during the hospital encounter of 12/03/22 (from the past 48 hour(s))  Urinalysis, Routine w reflex microscopic -Urine, Clean Catch  Status: Abnormal   Collection Time: 12/03/22 12:40 PM  Result Value Ref Range   Color, Urine YELLOW YELLOW   APPearance HAZY (A) CLEAR   Specific Gravity, Urine 1.014 1.005 - 1.030   pH 5.0 5.0 - 8.0   Glucose, UA NEGATIVE NEGATIVE mg/dL   Hgb urine dipstick NEGATIVE NEGATIVE   Bilirubin Urine NEGATIVE NEGATIVE   Ketones, ur NEGATIVE NEGATIVE mg/dL   Protein, ur NEGATIVE NEGATIVE mg/dL   Nitrite NEGATIVE NEGATIVE   Leukocytes,Ua NEGATIVE NEGATIVE    Comment: Performed at [facility], [address], [place], [place] [address]     Review of Systems  Gastrointestinal:  Positive for nausea and vomiting.   Physical Exam   Blood pressure 123/84, pulse 94, temperature 98.3 F (36.8 C), resp. rate 18, height 5\' 3"  (1.6 m), weight 94.3 kg, last menstrual period 06/18/2022.  Physical Exam Constitutional:      General: She is not in acute distress.    Appearance: Normal appearance. She is not ill-appearing, toxic-appearing or diaphoretic.   Skin:    General: Skin is warm.  Neurological:     Mental Status: She is alert and oriented to person, place, and time.  Psychiatric:        Behavior: Behavior normal.    MAU Course  Procedures  MDM  Lr bolus x 1 Zofran 8 mg IV.  She tolerated fluids and crackers following fluids/ medications and feels much better.  Assessment and Plan   A:  Nausea and vomiting in pregnancy   P:  Dc home Rx: Zofran Small frequent meals  Rainelle Sulewski, Harolyn Rutherford, NP 12/03/2022 3:40 PM

## 2022-12-03 NOTE — MAU Note (Signed)
.  Sonya Santiago is a 27 y.o. at [redacted]w[redacted]d here in MAU reporting: has had N/V for all pregnancy. Has gotten worse the past 3 days. Has not been on any meds for it . Stated she feels weak and dehydrated since she has not been able to keep much down.  LMP: Onset of complaint: 3 days Pain score: 0 Vitals:   12/03/22 1237  BP: 120/80  Pulse: 99  Resp: 18  Temp: 98.3 F (36.8 C)     ZOX:[id number] to obtain in triage Lab orders placed from triage:  u/a

## 2022-12-03 NOTE — MAU Note (Signed)
FHR via doppler 171 at bedside.

## 2022-12-06 LAB — OB RESULTS CONSOLE ABO/RH: RH Type: POSITIVE

## 2022-12-09 LAB — OB RESULTS CONSOLE ANTIBODY SCREEN: Antibody Screen: NEGATIVE

## 2022-12-09 LAB — OB RESULTS CONSOLE GC/CHLAMYDIA
Chlamydia: NEGATIVE
Neisseria Gonorrhea: NEGATIVE

## 2022-12-09 LAB — OB RESULTS CONSOLE RUBELLA ANTIBODY, IGM: Rubella: IMMUNE

## 2022-12-09 LAB — HEPATITIS C ANTIBODY: HCV Ab: NEGATIVE

## 2022-12-09 LAB — OB RESULTS CONSOLE HIV ANTIBODY (ROUTINE TESTING): HIV: NONREACTIVE

## 2022-12-09 LAB — OB RESULTS CONSOLE RPR: RPR: NONREACTIVE

## 2022-12-09 LAB — OB RESULTS CONSOLE HEPATITIS B SURFACE ANTIGEN: Hepatitis B Surface Ag: NEGATIVE

## 2023-01-28 ENCOUNTER — Encounter: Payer: Self-pay | Admitting: Family

## 2023-01-28 ENCOUNTER — Ambulatory Visit (INDEPENDENT_AMBULATORY_CARE_PROVIDER_SITE_OTHER): Payer: 59 | Admitting: Family

## 2023-01-28 VITALS — BP 108/73 | HR 83 | Temp 97.8°F | Ht 63.0 in | Wt 215.4 lb

## 2023-01-28 DIAGNOSIS — Z020 Encounter for examination for admission to educational institution: Secondary | ICD-10-CM | POA: Diagnosis not present

## 2023-01-28 DIAGNOSIS — Z1159 Encounter for screening for other viral diseases: Secondary | ICD-10-CM | POA: Diagnosis not present

## 2023-01-28 NOTE — Progress Notes (Signed)
Patient ID: Sonya Santiago, female    DOB: 1996/01/07, 27 y.o.   MRN: [id number]  Chief Complaint  Patient presents with   Annual Exam    college physical    HPI: College exam:  pt has forms for college requiring vaccine record, TB testing, titers if no vaccine proof, physical. Pt is [redacted] weeks pregnant. Needs TdaP vaccine, advised to ask OB if ok to give now or later, can get waiver, checking Varicella titer, may need this vaccine, but believes she had chicken pox as a child.   Assessment & Plan:  School physical exam - form completed and given to pt, she will get lab results off MyChart and f/u with OB re: vaccines.  -     QuantiFERON-TB Gold Plus -     Varicella zoster antibody, IgG  Need for hepatitis C screening test - -     Hepatitis C antibody   Subjective:    Outpatient Medications Prior to Visit  Medication Sig Dispense Refill   metFORMIN (GLUCOPHAGE) 500 MG tablet Take 500 mg by mouth 2 (two) times daily with a meal.     ondansetron (ZOFRAN) 4 MG tablet Take 1-2 tablets (4-8 mg total) by mouth every 8 (eight) hours as needed for nausea or vomiting. 20 tablet 0   Prenatal Vit-Fe Fumarate-FA (PRENATAL MULTIVITAMIN) TABS tablet Take 1 tablet by mouth daily at 12 noon.     aspirin EC 81 MG tablet Take 81 mg by mouth daily. Swallow whole. (Patient not taking: Reported on 01/28/2023)     metoprolol tartrate (LOPRESSOR) 25 MG tablet Take 0.5 tablets (12.5 mg total) by mouth 2 (two) times daily as needed. 30 tablet 3   progesterone 200 MG SUPP Place 200 mg vaginally at bedtime. (Patient not taking: Reported on 01/28/2023)     No facility-administered medications prior to visit.   Past Medical History:  Diagnosis Date   Allergy    Anxiety    GERD (gastroesophageal reflux disease)    MDD (major depressive disorder) 01/24/2020   OD (overdose of drug), intentional self-harm, initial encounter (HCC) 01/26/2020   Pre-diabetes    Past Surgical History:  Procedure Laterality Date    TONSILLECTOMY AND ADENOIDECTOMY     WISDOM TOOTH EXTRACTION     No Known Allergies    Objective:    Physical Exam Vitals and nursing note reviewed.  Constitutional:      Appearance: Normal appearance.  Cardiovascular:     Rate and Rhythm: Normal rate and regular rhythm.  Pulmonary:     Effort: Pulmonary effort is normal.     Breath sounds: Normal breath sounds.  Abdominal:     General: There is distension (mildly, 19w pregnancy).  Musculoskeletal:        General: Normal range of motion.  Skin:    General: Skin is warm and dry.  Neurological:     Mental Status: She is alert.  Psychiatric:        Mood and Affect: Mood normal.        Behavior: Behavior normal.    BP 108/73   Pulse 83   Temp 97.8 F (36.6 C) (Temporal)   Ht 5\' 3"  (1.6 m)   Wt 215 lb 6.4 oz (97.7 kg)   LMP 06/18/2022   SpO2 99%   BMI 38.16 kg/m  Wt Readings from Last 3 Encounters:  01/28/23 215 lb 6.4 oz (97.7 kg)  12/03/22 208 lb (94.3 kg)  11/11/22 215 lb (97.5 kg)  Dulce Sellar, NP

## 2023-02-01 LAB — QUANTIFERON-TB GOLD PLUS
Mitogen-NIL: >10 IU/mL
NIL: 0.06 IU/mL
QuantiFERON-TB Gold Plus: NEGATIVE
TB1-NIL: 0.03 IU/mL
TB2-NIL: 0.02 IU/mL

## 2023-02-01 LAB — HEPATITIS C ANTIBODY: Hepatitis C Ab: NONREACTIVE

## 2023-02-01 LAB — VARICELLA ZOSTER ANTIBODY, IGG: Varicella IgG: 560.6 index

## 2023-03-12 ENCOUNTER — Encounter (HOSPITAL_COMMUNITY): Payer: Self-pay | Admitting: Obstetrics and Gynecology

## 2023-03-12 ENCOUNTER — Inpatient Hospital Stay (HOSPITAL_COMMUNITY)
Admission: AD | Admit: 2023-03-12 | Discharge: 2023-03-12 | Disposition: A | Discharge status: Home / Self Care | Payer: 59 | Attending: Obstetrics and Gynecology | Admitting: Obstetrics and Gynecology

## 2023-03-12 DIAGNOSIS — R0602 Shortness of breath: Secondary | ICD-10-CM | POA: Diagnosis not present

## 2023-03-12 DIAGNOSIS — O99891 Other specified diseases and conditions complicating pregnancy: Secondary | ICD-10-CM | POA: Diagnosis not present

## 2023-03-12 DIAGNOSIS — Z7984 Long term (current) use of oral hypoglycemic drugs: Secondary | ICD-10-CM | POA: Diagnosis not present

## 2023-03-12 DIAGNOSIS — Z3A25 25 weeks gestation of pregnancy: Secondary | ICD-10-CM | POA: Diagnosis not present

## 2023-03-12 DIAGNOSIS — R030 Elevated blood-pressure reading, without diagnosis of hypertension: Secondary | ICD-10-CM | POA: Insufficient documentation

## 2023-03-12 DIAGNOSIS — H538 Other visual disturbances: Secondary | ICD-10-CM | POA: Diagnosis not present

## 2023-03-12 DIAGNOSIS — O26892 Other specified pregnancy related conditions, second trimester: Secondary | ICD-10-CM | POA: Insufficient documentation

## 2023-03-12 DIAGNOSIS — R42 Dizziness and giddiness: Secondary | ICD-10-CM | POA: Insufficient documentation

## 2023-03-12 DIAGNOSIS — Z79899 Other long term (current) drug therapy: Secondary | ICD-10-CM | POA: Insufficient documentation

## 2023-03-12 LAB — URINALYSIS, ROUTINE W REFLEX MICROSCOPIC
Bilirubin Urine: NEGATIVE
Glucose, UA: NEGATIVE mg/dL
Hgb urine dipstick: NEGATIVE
Ketones, ur: NEGATIVE mg/dL
Leukocytes,Ua: NEGATIVE
Nitrite: NEGATIVE
Protein, ur: NEGATIVE mg/dL
Specific Gravity, Urine: 1.005 (ref 1.005–1.030)
pH: 6 (ref 5.0–8.0)

## 2023-03-12 LAB — CBC WITH DIFFERENTIAL/PLATELET
Abs Immature Granulocytes: 0.09 10*3/uL — ABNORMAL HIGH (ref 0.00–0.07)
Basophils Absolute: 0 10*3/uL (ref 0.0–0.1)
Basophils Relative: 0 %
Eosinophils Absolute: 0.1 10*3/uL (ref 0.0–0.5)
Eosinophils Relative: 1 %
HCT: 30.6 % — ABNORMAL LOW (ref 36.0–46.0)
Hemoglobin: 10.1 g/dL — ABNORMAL LOW (ref 12.0–15.0)
Immature Granulocytes: 1 %
Lymphocytes Relative: 21 %
Lymphs Abs: 3 10*3/uL (ref 0.7–4.0)
MCH: 26.6 pg (ref 26.0–34.0)
MCHC: 33 g/dL (ref 30.0–36.0)
MCV: 80.5 fL (ref 80.0–100.0)
Monocytes Absolute: 0.8 10*3/uL (ref 0.1–1.0)
Monocytes Relative: 5 %
Neutro Abs: 10.2 10*3/uL — ABNORMAL HIGH (ref 1.7–7.7)
Neutrophils Relative %: 72 %
Platelets: 315 10*3/uL (ref 150–400)
RBC: 3.8 MIL/uL — ABNORMAL LOW (ref 3.87–5.11)
RDW: 14.8 % (ref 11.5–15.5)
WBC: 14.2 10*3/uL — ABNORMAL HIGH (ref 4.0–10.5)
nRBC: 0 % (ref 0.0–0.2)

## 2023-03-12 LAB — COMPREHENSIVE METABOLIC PANEL
ALT: 13 U/L (ref 0–44)
AST: 16 U/L (ref 15–41)
Albumin: 3 g/dL — ABNORMAL LOW (ref 3.5–5.0)
Alkaline Phosphatase: 65 U/L (ref 38–126)
Anion gap: 11 (ref 5–15)
BUN: 6 mg/dL (ref 6–20)
CO2: 21 mmol/L — ABNORMAL LOW (ref 22–32)
Calcium: 9.6 mg/dL (ref 8.9–10.3)
Chloride: 104 mmol/L (ref 98–111)
Creatinine, Ser: 0.6 mg/dL (ref 0.44–1.00)
GFR, Estimated: >60 mL/min (ref >60)
Glucose, Bld: 105 mg/dL — ABNORMAL HIGH (ref 70–99)
Potassium: 3.4 mmol/L — ABNORMAL LOW (ref 3.5–5.1)
Sodium: 136 mmol/L (ref 135–145)
Total Bilirubin: 0.5 mg/dL (ref 0.3–1.2)
Total Protein: 6.3 g/dL — ABNORMAL LOW (ref 6.5–8.1)

## 2023-03-12 LAB — GLUCOSE, CAPILLARY: Glucose-Capillary: 100 mg/dL — ABNORMAL HIGH (ref 70–99)

## 2023-03-12 NOTE — MAU Provider Note (Signed)
History     CSN: [id number]  Arrival date and time: 03/12/23 2102   Event Date/Time   First Provider Initiated Contact with Patient 03/12/23 2159      Chief Complaint  Patient presents with   Shortness of Breath    Sonya Santiago is a 27 y.o. G1P0 at [redacted]w[redacted]d who receives care at [facility]. She reports her next appt is Thursday Aug 29th and she sees Taavon.   She presents today for SOB, chest tightness, and elevated bp.  She reports she has been experiencing ~ 2 hours ago when laying on the couch.  She states continues to feel SOB.  She endorses history of asthma as a child.  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   She states at her ob appts her bps have been normal and today 140s/90s.  She reports some dizziness and blurry vision. She states the dizziness has been throughout the day and she has not had snacks.    She reports eating a biscuit, subway-6 in BLT, and tacos-chicken for dinner (around 1900). She reports drinking water (~4-5 bottles) and one Dr. Reino Kent.   Patient endorses fetal movement. She denies vaginal concerns.   Patient reports taking metformin, vitamin d, iron, and PNV daily. She reports she is not checking her BS.   Prenatal records from [facility] reviewed.   OB History     Gravida  1   Para  0   Term  0   Preterm  0   AB  0   Living  0      SAB  0   IAB  0   Ectopic  0   Multiple  0   Live Births  0           Past Medical History:  Diagnosis Date   Allergy    Anxiety    GERD (gastroesophageal reflux disease)    MDD (major depressive disorder) 01/24/2020   OD (overdose of drug), intentional self-harm, initial encounter (HCC) 01/26/2020   Pre-diabetes     Past Surgical History:  Procedure Laterality Date   TONSILLECTOMY AND ADENOIDECTOMY     WISDOM TOOTH EXTRACTION      Family History  Problem Relation Age of Onset   Cancer Mother        Breast, Thyroid   Asthma Brother     Cancer Maternal Grandmother    Diabetes Maternal Grandfather    Heart disease Maternal Grandfather    Diabetes Paternal Grandmother    Cirrhosis Paternal Grandfather    Alcoholism Paternal Grandfather     Social History   Tobacco Use   Smoking status: Former    Current packs/day: 0.00    Average packs/day: 0.1 packs/day for 0.9 years (0.1 ttl pk-yrs)    Types: Cigarettes    Start date: 08/13/2013    Quit date: 07/12/2014    Years since quitting: 8.6   Smokeless tobacco: Never  Vaping Use   Vaping status: Former  Substance Use Topics   Alcohol use: Not Currently   Drug use: Not Currently    Types: Other-see comments    Allergies: No Known Allergies  Medications Prior to Admission  Medication Sig Dispense Refill Last Dose   metFORMIN (GLUCOPHAGE) 500 MG tablet Take 500 mg by mouth 2 (two) times daily with a meal.   03/12/2023   Prenatal Vit-Fe Fumarate-FA (PRENATAL MULTIVITAMIN) TABS tablet Take 1 tablet by mouth daily at 12 noon.  03/12/2023   aspirin EC 81 MG tablet Take 81 mg by mouth daily. Swallow whole. (Patient not taking: Reported on 01/28/2023)      metoprolol tartrate (LOPRESSOR) 25 MG tablet Take 0.5 tablets (12.5 mg total) by mouth 2 (two) times daily as needed. 30 tablet 3    ondansetron (ZOFRAN) 4 MG tablet Take 1-2 tablets (4-8 mg total) by mouth every 8 (eight) hours as needed for nausea or vomiting. 20 tablet 0     Review of Systems  Constitutional:  Negative for chills and fever.  Eyes:  Positive for visual disturbance (Blurry Vision).  Respiratory:  Positive for shortness of breath. Negative for cough.   Gastrointestinal:  Negative for abdominal pain.  Genitourinary:  Negative for difficulty urinating, dysuria, vaginal bleeding and vaginal discharge.   Physical Exam   Blood pressure 132/72, pulse 86, temperature 98.5 F (36.9 C), temperature source Oral, resp. rate 18, height 5\' 3"  (1.6 m), weight 101.7 kg, last menstrual period 06/18/2022, SpO2  100%.  Vitals:   03/12/23 2129 03/12/23 2146 03/12/23 2217 03/12/23 2232  BP: 132/72 128/82 124/86 130/84   03/12/23 2246 03/12/23 2302 03/12/23 2320  BP: 126/84 115/69 108/63     Physical Exam Vitals reviewed.  Constitutional:      Appearance: She is well-developed.  HENT:     Head: Normocephalic and atraumatic.  Eyes:     Conjunctiva/sclera: Conjunctivae normal.  Cardiovascular:     Rate and Rhythm: Normal rate and regular rhythm.     Heart sounds: Normal heart sounds.  Pulmonary:     Effort: Pulmonary effort is normal. No respiratory distress.     Breath sounds: Normal breath sounds. No decreased breath sounds.  Chest:     Chest wall: No tenderness.  Abdominal:     Palpations: Abdomen is soft.     Tenderness: There is no abdominal tenderness.     Comments: Gravid--fundal height appears LGA, Soft, NT   Musculoskeletal:        General: Normal range of motion.     Cervical back: Normal range of motion.  Skin:    General: Skin is warm and dry.  Neurological:     Mental Status: She is alert and oriented to person, place, and time.  Psychiatric:        Mood and Affect: Mood normal.        Behavior: Behavior normal.     Fetal Assessment 140 bpm, Mod Var, -Decels, -Accels Toco: No ctx graphed  MAU Course   Results for orders placed or performed during the hospital encounter of 03/12/23 (from the past 24 hour(s))  Urinalysis, Routine w reflex microscopic -Urine, Clean Catch     Status: Abnormal   Collection Time: 03/12/23  9:29 PM  Result Value Ref Range   Color, Urine STRAW (A) YELLOW   APPearance CLEAR CLEAR   Specific Gravity, Urine 1.005 1.005 - 1.030   pH 6.0 5.0 - 8.0   Glucose, UA NEGATIVE NEGATIVE mg/dL   Hgb urine dipstick NEGATIVE NEGATIVE   Bilirubin Urine NEGATIVE NEGATIVE   Ketones, ur NEGATIVE NEGATIVE mg/dL   Protein, ur NEGATIVE NEGATIVE mg/dL   Nitrite NEGATIVE NEGATIVE   Leukocytes,Ua NEGATIVE NEGATIVE  Glucose, capillary     Status:  Abnormal   Collection Time: 03/12/23 10:14 PM  Result Value Ref Range   Glucose-Capillary 100 (H) 70 - 99 mg/dL   Comment 1 Notify RN    Comment 2 Document in Chart   CBC with Differential/Platelet  Status: Abnormal   Collection Time: 03/12/23 10:24 PM  Result Value Ref Range   WBC 14.2 (H) 4.0 - 10.5 K/uL   RBC 3.80 (L) 3.87 - 5.11 MIL/uL   Hemoglobin 10.1 (L) 12.0 - 15.0 g/dL   HCT 78.2 (L) 95.6 - 21.3 %   MCV 80.5 80.0 - 100.0 fL   MCH 26.6 26.0 - 34.0 pg   MCHC 33.0 30.0 - 36.0 g/dL   RDW 08.6 57.8 - 46.9 %   Platelets 315 150 - 400 K/uL   nRBC 0.0 0.0 - 0.2 %   Neutrophils Relative % 72 %   Neutro Abs 10.2 (H) 1.7 - 7.7 K/uL   Lymphocytes Relative 21 %   Lymphs Abs 3.0 0.7 - 4.0 K/uL   Monocytes Relative 5 %   Monocytes Absolute 0.8 0.1 - 1.0 K/uL   Eosinophils Relative 1 %   Eosinophils Absolute 0.1 0.0 - 0.5 K/uL   Basophils Relative 0 %   Basophils Absolute 0.0 0.0 - 0.1 K/uL   Immature Granulocytes 1 %   Abs Immature Granulocytes 0.09 (H) 0.00 - 0.07 K/uL  Comprehensive metabolic panel     Status: Abnormal   Collection Time: 03/12/23 10:24 PM  Result Value Ref Range   Sodium 136 135 - 145 mmol/L   Potassium 3.4 (L) 3.5 - 5.1 mmol/L   Chloride 104 98 - 111 mmol/L   CO2 21 (L) 22 - 32 mmol/L   Glucose, Bld 105 (H) 70 - 99 mg/dL   BUN 6 6 - 20 mg/dL   Creatinine, Ser 6.29 0.44 - 1.00 mg/dL   Calcium 9.6 8.9 - 52.8 mg/dL   Total Protein 6.3 (L) 6.5 - 8.1 g/dL   Albumin 3.0 (L) 3.5 - 5.0 g/dL   AST 16 15 - 41 U/L   ALT 13 0 - 44 U/L   Alkaline Phosphatase 65 38 - 126 U/L   Total Bilirubin 0.5 0.3 - 1.2 mg/dL   GFR, Estimated >41 >32 mL/min   Anion gap 11 5 - 15   No results found.  MDM PE Labs:CBC/D, CMP, CBG EFM EKG Assessment and Plan  26 year old G1P0  SIUP at 25.1 weeks Cat I FT SOB Elevated BP Dizziness  -POC Reviewed -Exam performed and findings discussed. -Reassured that lungs sounds are clear and without concern. Will defer Chest XRay  at current.  Patient agreeable.  -Discussed value of small snacks throughout the day to maintain blood sugar levels and decrease incidents of dizziness. -Reviewed collecting labs as anemia and electrolyte imbalances can cause some SOB.  -EKG performed and normal. Patient updated.  -Labs ordered. -CBG collected and normal. -BP normotensive.  -Monitor and await results.  Cherre Robins MSN, CNM 03/12/2023, 9:59 PM   Reassessment (11:18 PM) -Results as above. -Provider to bedside to discuss. -Informed of elevated white count and slight left shift. -Instructed to monitor symptoms and advised to test for Covid/flu if symptoms worsen or with onset of new symptoms. -Reviewed HgB level and minimal difference when compared to [facility]. Currently taking iron supplement.  -Patient without questions and verbalizes understanding.  -Keep next appt as scheduled. -Encouraged to call primary office or return to MAU if symptoms worsen or with the onset of new symptoms. -Discharged to home in stable condition.  Cherre Robins MSN, CNM Advanced Practice Provider, [facility] [employer]

## 2023-03-12 NOTE — MAU Note (Signed)
.  Sonya Santiago is a 27 y.o. at [redacted]w[redacted]d here in MAU reporting: SOB starting about 1.5 hr ago, reports SOB at this time. Patient reports taking manual blood pressure at home was 140s/90s; denies HTN prior to this. Patient reports HA 2/10 at this and blurred vision, denies RUQ pain.  Patient also reports chest tightness at this time. Patient also reports feeling dizzy at this time  Patient denies VB, LOF and reports +FM  Onset of complaint: 1.5hr ago  Pain score: 2/10 HA  Vitals:   03/12/23 2129 03/12/23 2131  BP: 132/72   Pulse: 86   Resp: 18   Temp: 98.5 F (36.9 C)   SpO2: 100% 100%     FHT:140 Lab orders placed from triage:  UA

## 2023-04-06 ENCOUNTER — Ambulatory Visit: Payer: 59

## 2023-05-26 LAB — OB RESULTS CONSOLE GBS: GBS: NEGATIVE

## 2023-06-13 ENCOUNTER — Encounter (HOSPITAL_COMMUNITY): Payer: Self-pay | Admitting: *Deleted

## 2023-06-13 ENCOUNTER — Telehealth (HOSPITAL_COMMUNITY): Payer: Self-pay | Admitting: *Deleted

## 2023-06-13 NOTE — Telephone Encounter (Signed)
Preadmission screen  

## 2023-06-15 ENCOUNTER — Other Ambulatory Visit: Payer: Self-pay | Admitting: Obstetrics and Gynecology

## 2023-06-17 ENCOUNTER — Encounter (HOSPITAL_COMMUNITY): Payer: 59

## 2023-06-20 ENCOUNTER — Inpatient Hospital Stay (HOSPITAL_COMMUNITY): Payer: 59

## 2023-06-20 ENCOUNTER — Inpatient Hospital Stay (HOSPITAL_COMMUNITY)
Admission: RE | Admit: 2023-06-20 | Discharge: 2023-06-24 | DRG: 788 | Disposition: A | Discharge status: Home / Self Care | Payer: 59 | Attending: Obstetrics and Gynecology | Admitting: Obstetrics and Gynecology

## 2023-06-20 ENCOUNTER — Inpatient Hospital Stay (HOSPITAL_COMMUNITY): Payer: 59 | Admitting: Anesthesiology

## 2023-06-20 ENCOUNTER — Encounter (HOSPITAL_COMMUNITY): Payer: Self-pay | Admitting: Obstetrics and Gynecology

## 2023-06-20 ENCOUNTER — Other Ambulatory Visit: Payer: Self-pay

## 2023-06-20 DIAGNOSIS — Z7984 Long term (current) use of oral hypoglycemic drugs: Secondary | ICD-10-CM | POA: Diagnosis not present

## 2023-06-20 DIAGNOSIS — Z3490 Encounter for supervision of normal pregnancy, unspecified, unspecified trimester: Principal | ICD-10-CM | POA: Diagnosis present

## 2023-06-20 DIAGNOSIS — K219 Gastro-esophageal reflux disease without esophagitis: Secondary | ICD-10-CM | POA: Diagnosis present

## 2023-06-20 DIAGNOSIS — O9081 Anemia of the puerperium: Secondary | ICD-10-CM | POA: Diagnosis not present

## 2023-06-20 DIAGNOSIS — Z8249 Family history of ischemic heart disease and other diseases of the circulatory system: Secondary | ICD-10-CM | POA: Diagnosis not present

## 2023-06-20 DIAGNOSIS — O24419 Gestational diabetes mellitus in pregnancy, unspecified control: Secondary | ICD-10-CM

## 2023-06-20 DIAGNOSIS — O9962 Diseases of the digestive system complicating childbirth: Secondary | ICD-10-CM | POA: Diagnosis present

## 2023-06-20 DIAGNOSIS — O9902 Anemia complicating childbirth: Secondary | ICD-10-CM | POA: Diagnosis present

## 2023-06-20 DIAGNOSIS — Z833 Family history of diabetes mellitus: Secondary | ICD-10-CM

## 2023-06-20 DIAGNOSIS — Z3A39 39 weeks gestation of pregnancy: Secondary | ICD-10-CM | POA: Diagnosis not present

## 2023-06-20 DIAGNOSIS — O24425 Gestational diabetes mellitus in childbirth, controlled by oral hypoglycemic drugs: Secondary | ICD-10-CM | POA: Diagnosis present

## 2023-06-20 DIAGNOSIS — O99214 Obesity complicating childbirth: Secondary | ICD-10-CM | POA: Diagnosis present

## 2023-06-20 DIAGNOSIS — E66813 Obesity, class 3: Secondary | ICD-10-CM | POA: Diagnosis present

## 2023-06-20 DIAGNOSIS — D62 Acute posthemorrhagic anemia: Secondary | ICD-10-CM | POA: Diagnosis not present

## 2023-06-20 DIAGNOSIS — Z87891 Personal history of nicotine dependence: Secondary | ICD-10-CM

## 2023-06-20 DIAGNOSIS — Z825 Family history of asthma and other chronic lower respiratory diseases: Secondary | ICD-10-CM

## 2023-06-20 DIAGNOSIS — F411 Generalized anxiety disorder: Secondary | ICD-10-CM | POA: Diagnosis present

## 2023-06-20 LAB — CBC
HCT: 32 % — ABNORMAL LOW (ref 36.0–46.0)
Hemoglobin: 10.2 g/dL — ABNORMAL LOW (ref 12.0–15.0)
MCH: 25.2 pg — ABNORMAL LOW (ref 26.0–34.0)
MCHC: 31.9 g/dL (ref 30.0–36.0)
MCV: 79 fL — ABNORMAL LOW (ref 80.0–100.0)
Platelets: 291 10*3/uL (ref 150–400)
RBC: 4.05 MIL/uL (ref 3.87–5.11)
RDW: 14.6 % (ref 11.5–15.5)
WBC: 10.9 10*3/uL — ABNORMAL HIGH (ref 4.0–10.5)
nRBC: 0 % (ref 0.0–0.2)

## 2023-06-20 LAB — GLUCOSE, CAPILLARY
Glucose-Capillary: 158 mg/dL — ABNORMAL HIGH (ref 70–99)
Glucose-Capillary: 77 mg/dL (ref 70–99)
Glucose-Capillary: 93 mg/dL (ref 70–99)
Glucose-Capillary: 96 mg/dL (ref 70–99)
Glucose-Capillary: 97 mg/dL (ref 70–99)
Glucose-Capillary: 99 mg/dL (ref 70–99)

## 2023-06-20 LAB — RPR: RPR Ser Ql: NONREACTIVE

## 2023-06-20 MED ORDER — LACTATED RINGERS IV SOLN: 500.0000 mL | Freq: Once | INTRAVENOUS | Status: DC

## 2023-06-20 MED ORDER — ONDANSETRON HCL 4 MG/2ML IJ SOLN: 4.0000 mg | Freq: Four times a day (QID) | INTRAMUSCULAR | Status: DC | PRN

## 2023-06-20 MED ORDER — OXYTOCIN-SODIUM CHLORIDE 30-0.9 UT/500ML-% IV SOLN: 2.5000 [IU]/h | INTRAVENOUS | Status: DC

## 2023-06-20 MED ORDER — LACTATED RINGERS IV SOLN
500.0000 mL | Freq: Once | INTRAVENOUS | Status: AC
  Administered 2023-06-20: 500 mL via INTRAVENOUS

## 2023-06-20 MED ORDER — ACETAMINOPHEN 325 MG PO TABS
650.0000 mg | ORAL_TABLET | ORAL | Status: DC | PRN
  Administered 2023-06-20 – 2023-06-21 (×2): 650 mg via ORAL
  Filled 2023-06-20 (×2): qty 2

## 2023-06-20 MED ORDER — EPHEDRINE 5 MG/ML INJ: 10.0000 mg | INTRAVENOUS | Status: DC | PRN

## 2023-06-20 MED ORDER — SOD CITRATE-CITRIC ACID 500-334 MG/5ML PO SOLN
30.0000 mL | ORAL | Status: DC | PRN
  Administered 2023-06-21: 30 mL via ORAL
  Filled 2023-06-20: qty 30

## 2023-06-20 MED ORDER — LACTATED RINGERS IV SOLN: 500.0000 mL | INTRAVENOUS | Status: DC | PRN

## 2023-06-20 MED ORDER — OXYTOCIN BOLUS FROM INFUSION: 333.0000 mL | Freq: Once | INTRAVENOUS | Status: DC

## 2023-06-20 MED ORDER — DIPHENHYDRAMINE HCL 50 MG/ML IJ SOLN
12.5000 mg | INTRAMUSCULAR | Status: DC | PRN
  Administered 2023-06-20 – 2023-06-21 (×2): 12.5 mg via INTRAVENOUS
  Filled 2023-06-20: qty 1

## 2023-06-20 MED ORDER — TERBUTALINE SULFATE 1 MG/ML IJ SOLN: 0.2500 mg | Freq: Once | INTRAMUSCULAR | Status: DC | PRN

## 2023-06-20 MED ORDER — PHENYLEPHRINE 80 MCG/ML (10ML) SYRINGE FOR IV PUSH (FOR BLOOD PRESSURE SUPPORT): 80.0000 ug | PREFILLED_SYRINGE | INTRAVENOUS | Status: DC | PRN

## 2023-06-20 MED ORDER — MISOPROSTOL 25 MCG QUARTER TABLET
25.0000 ug | ORAL_TABLET | ORAL | Status: DC | PRN
  Administered 2023-06-20: 25 ug via VAGINAL
  Filled 2023-06-20: qty 1

## 2023-06-20 MED ORDER — LIDOCAINE HCL (PF) 1 % IJ SOLN: 30.0000 mL | INTRAMUSCULAR | Status: DC | PRN

## 2023-06-20 MED ORDER — FENTANYL CITRATE (PF) 100 MCG/2ML IJ SOLN
100.0000 ug | INTRAMUSCULAR | Status: DC | PRN
  Administered 2023-06-20 (×2): 100 ug via INTRAVENOUS
  Filled 2023-06-20 (×2): qty 2

## 2023-06-20 MED ORDER — LACTATED RINGERS IV SOLN: INTRAVENOUS | Status: DC

## 2023-06-20 MED ORDER — LIDOCAINE HCL (PF) 1 % IJ SOLN
INTRAMUSCULAR | Status: DC | PRN
  Administered 2023-06-20: 2 mL via EPIDURAL
  Administered 2023-06-20: 5 mL via EPIDURAL
  Administered 2023-06-20: 3 mL via EPIDURAL

## 2023-06-20 MED ORDER — TERBUTALINE SULFATE 1 MG/ML IJ SOLN
0.2500 mg | Freq: Once | INTRAMUSCULAR | Status: AC | PRN
  Administered 2023-06-20: 0.25 mg via SUBCUTANEOUS
  Filled 2023-06-20: qty 1

## 2023-06-20 MED ORDER — PHENYLEPHRINE 80 MCG/ML (10ML) SYRINGE FOR IV PUSH (FOR BLOOD PRESSURE SUPPORT)
80.0000 ug | PREFILLED_SYRINGE | INTRAVENOUS | Status: DC | PRN
Start: 2023-06-20 — End: 2023-06-21

## 2023-06-20 MED ORDER — OXYTOCIN-SODIUM CHLORIDE 30-0.9 UT/500ML-% IV SOLN
1.0000 m[IU]/min | INTRAVENOUS | Status: DC
  Administered 2023-06-20: 2 m[IU]/min via INTRAVENOUS
  Filled 2023-06-20: qty 500

## 2023-06-20 MED ORDER — OXYTOCIN-SODIUM CHLORIDE 30-0.9 UT/500ML-% IV SOLN
1.0000 m[IU]/min | INTRAVENOUS | Status: DC
  Filled 2023-06-20: qty 500

## 2023-06-20 MED ORDER — FENTANYL-BUPIVACAINE-NACL 0.5-0.125-0.9 MG/250ML-% EP SOLN
12.0000 mL/h | EPIDURAL | Status: DC | PRN
  Administered 2023-06-20 – 2023-06-21 (×2): 12 mL/h via EPIDURAL
  Filled 2023-06-20 (×2): qty 250

## 2023-06-20 NOTE — Progress Notes (Signed)
GBS negative confirmed by Dr. Billy Coast checking with office staff.

## 2023-06-20 NOTE — Progress Notes (Signed)
Sonya Santiago is a 27 y.o. G1P0000 at [redacted]w[redacted]d by LMP admitted for induction of labor due to Gestational diabetes.  Subjective: Resting with epidural.   Objective: BP 124/77   Pulse (!) 102   Temp 98 F (36.7 C) (Oral)   Resp 20   Ht 5\' 3"  (1.6 m)   Wt 106.9 kg   LMP 06/18/2022   SpO2 100%   BMI 41.73 kg/m  No intake/output data recorded. Total I/O In: -  Out: 450 [Urine:450]  FHT:  FHR: 155 bpm, variability: moderate,  accelerations:  Present,  decelerations:  Absent UC:   regular, every 1-68minutes SVE: deferred MVUs >200 with coupling and occ tachysystole  Labs: Lab Results  Component Value Date   WBC 10.9 (H) 06/20/2023   HGB 10.2 (L) 06/20/2023   HCT 32.0 (L) 06/20/2023   MCV 79.0 (L) 06/20/2023   PLT 291 06/20/2023    Assessment / Plan: Induction of labor due to gestational diabetes,  progressing well on pitocin- BS wnl Latent phase with slow progress  Labor: Progressing normally Preeclampsia:  no signs or symptoms of toxicity Fetal Wellbeing:  Category I Pain Control:  epidural I/D:  n/a Anticipated MOD:  NSVD  Lenoard Aden, MD 06/20/2023, 6:46 PM

## 2023-06-20 NOTE — H&P (Signed)
Sonya Santiago is a 27 y.o. female presenting for IOL for presumed A2DM. OB History     Gravida  1   Para  0   Term  0   Preterm  0   AB  0   Living  0      SAB  0   IAB  0   Ectopic  0   Multiple  0   Live Births  0          Past Medical History:  Diagnosis Date   Allergy    Anxiety    GERD (gastroesophageal reflux disease)    MDD (major depressive disorder) 01/24/2020   OD (overdose of drug), intentional self-harm, initial encounter (HCC) 01/26/2020   Pre-diabetes    Past Surgical History:  Procedure Laterality Date   TONSILLECTOMY AND ADENOIDECTOMY     WISDOM TOOTH EXTRACTION     Family History: family history includes Alcoholism in her paternal grandfather; Asthma in her brother; Cancer in her maternal grandmother and mother; Cirrhosis in her paternal grandfather; Diabetes in her maternal grandfather and paternal grandmother; Heart disease in her maternal grandfather. Social History:  reports that she quit smoking about 8 years ago. Her smoking use included cigarettes. She started smoking about 9 years ago. She has a 0.1 pack-year smoking history. She has never used smokeless tobacco. She reports that she does not currently use alcohol. She reports that she does not currently use drugs after having used the following drugs: Other-see comments.     Maternal Diabetes: Yes:  Diabetes Type:  Insulin/Medication controlled Genetic Screening: Normal Maternal Ultrasounds/Referrals: Normal Fetal Ultrasounds or other Referrals: fetal echo Maternal Substance Abuse:  No Significant Maternal Medications:  Meds include: Other:  Significant Maternal Lab Results:  Group B Strep negative Number of Prenatal Visits:greater than 3 verified prenatal visits Maternal Vaccinations:TDap Other Comments:  None  Review of Systems  Constitutional: Negative.   All other systems reviewed and are negative.  Maternal Medical History:  Reason for admission: Contractions.    Contractions: Frequency: rare.   Perceived severity is mild.   Fetal activity: Last perceived fetal movement was within the past hour.   Prenatal complications: no prenatal complications Prenatal Complications - Diabetes: gestational. Diabetes is managed by oral agent (monotherapy).     Dilation: Fingertip Effacement (%): Thick Station: -3 Exam by:: Amita S RN Blood pressure 128/71, pulse 66, temperature 98.2 F (36.8 C), temperature source Oral, resp. rate 17, height 5\' 3"  (1.6 m), weight 106.9 kg, last menstrual period 06/18/2022, SpO2 99%. Maternal Exam:  Uterine Assessment: Contraction frequency is rare.  Abdomen: Patient reports no abdominal tenderness. Fetal presentation: vertex Introitus: Normal vulva. Normal vagina.  Ferning test: not done.  Nitrazine test: not done. Amniotic fluid character: not assessed. Pelvis: adequate for delivery.   Cervix: Cervix evaluated by digital exam.     Physical Exam Constitutional:      Appearance: Normal appearance.  HENT:     Head: Normocephalic and atraumatic.  Cardiovascular:     Rate and Rhythm: Normal rate and regular rhythm.     Pulses: Normal pulses.     Heart sounds: Normal heart sounds.  Pulmonary:     Effort: Pulmonary effort is normal.     Breath sounds: Normal breath sounds.  Genitourinary:    General: Normal vulva.  Musculoskeletal:        General: Normal range of motion.     Cervical back: Normal range of motion and neck supple.  Skin:  General: Skin is warm and dry.  Neurological:     General: No focal deficit present.     Mental Status: She is alert and oriented to person, place, and time.  Psychiatric:        Mood and Affect: Mood normal.        Behavior: Behavior normal.     Prenatal labs: ABO, Rh: --/--/O POS (12/02 0028) Antibody: NEG (12/02 0028) Rubella: Immune (05/23 0000) RPR: Nonreactive (05/23 0000)  HBsAg: Negative (05/23 0000)  HIV: Non-reactive (05/23 0000)  GBS: Negative/-- (11/07  0000)   Assessment/Plan: 39w IUP A2DM on metformin IVF pregnancy BS q 4 IOL  Sonya Santiago J 06/20/2023, 7:30 AM

## 2023-06-20 NOTE — Progress Notes (Addendum)
Sonya Santiago is a 27 y.o. G1P0000 at [redacted]w[redacted]d by LMP admitted for induction of labor due to Gestational diabetes.  Subjective: Resting with epidural.   Objective: BP 120/73   Pulse 87   Temp 97.8 F (36.6 C) (Oral)   Resp 15   Ht 5\' 3"  (1.6 m)   Wt 106.9 kg   LMP 06/18/2022   SpO2 100%   BMI 41.73 kg/m  I/O last 3 completed shifts: In: -  Out: 450 [Urine:450] No intake/output data recorded.  FHT:  FHR: 155 bpm, variability: moderate,  accelerations:  Present,  decelerations:  Absent UC:   regular, every 1-34minutes SVE: deferred MVUs >200 with coupling and occ tachysystole Periods of tachsystole noted throughout the day. period with decreased variability and non recurrent late decels. RN dced Pit and gave terb per protocol. Category 1 tracing noted before terb given.  Labs: Lab Results  Component Value Date   WBC 10.9 (H) 06/20/2023   HGB 10.2 (L) 06/20/2023   HCT 32.0 (L) 06/20/2023   MCV 79.0 (L) 06/20/2023   PLT 291 06/20/2023    Assessment / Plan: Induction of labor due to gestational diabetes,  progressing well on pitocin- BS wnl Latent phase with slow progress  Labor: latent phase Preeclampsia:  no signs or symptoms of toxicity Fetal Wellbeing:  Category I now, category 1-2 tracing except for interval noted. Pain Control:  epidural I/D:  n/a Anticipated MOD:  NSVD  Lenoard Aden, MD 06/20/2023, 9:36 PM  Addendum 2149: Category 1 tracing continues.No decels. Accels noted. RN expresses concerns will augmentation. Will leave Pitocin off and reassess.

## 2023-06-20 NOTE — Progress Notes (Signed)
Sonya Santiago is a 27 y.o. G1P0000 at [redacted]w[redacted]d by LMP admitted for induction of labor due to Gestational diabetes.  Subjective: Just received epidural. Unable to trace contractions  Objective: BP 121/75   Pulse 79   Temp 97.9 F (36.6 C) (Oral)   Resp 19   Ht 5\' 3"  (1.6 m)   Wt 106.9 kg   LMP 06/18/2022   SpO2 99%   BMI 41.73 kg/m  No intake/output data recorded. No intake/output data recorded.  FHT:  FHR: 155 bpm, variability: moderate,  accelerations:  Present,  decelerations:  Absent UC:   irregular, every 2-4 minutes SVE: 2+/80/-1 IUPC placed without difficulty  Labs: Lab Results  Component Value Date   WBC 10.9 (H) 06/20/2023   HGB 10.2 (L) 06/20/2023   HCT 32.0 (L) 06/20/2023   MCV 79.0 (L) 06/20/2023   PLT 291 06/20/2023    Assessment / Plan: Induction of labor due to gestational diabetes,  progressing well on pitocin  Labor: Progressing normally Preeclampsia:  no signs or symptoms of toxicity Fetal Wellbeing:  Category I Pain Control:  epidural I/D:  n/a Anticipated MOD:  NSVD  Lenoard Aden, MD 06/20/2023, 2:19 PM

## 2023-06-20 NOTE — Anesthesia Procedure Notes (Signed)
Epidural Patient location during procedure: OB Start time: 06/20/2023 1:25 PM End time: 06/20/2023 1:32 PM  Staffing Anesthesiologist: Marcene Duos, MD Performed: anesthesiologist   Preanesthetic Checklist Completed: patient identified, IV checked, site marked, risks and benefits discussed, surgical consent, monitors and equipment checked, pre-op evaluation and timeout performed  Epidural Patient position: sitting Prep: DuraPrep and site prepped and draped Patient monitoring: continuous pulse ox and blood pressure Approach: midline Location: L3-L4 Injection technique: LOR air  Needle:  Needle type: Tuohy  Needle gauge: 17 G Needle length: 9 cm and 9 Needle insertion depth: 6.5 cm Catheter type: closed end flexible Catheter size: 19 Gauge Catheter at skin depth: 12 cm Test dose: negative  Assessment Events: blood not aspirated, no cerebrospinal fluid, injection not painful, no injection resistance, no paresthesia and negative IV test

## 2023-06-20 NOTE — Anesthesia Preprocedure Evaluation (Signed)
Anesthesia Evaluation  Patient identified by MRN, date of birth, ID band Patient awake    Reviewed: Allergy & Precautions, Patient's Chart, lab work & pertinent test results  Airway Mallampati: III  TM Distance: >3 FB     Dental no notable dental hx.    Pulmonary former smoker   Pulmonary exam normal        Cardiovascular negative cardio ROS Normal cardiovascular exam     Neuro/Psych negative neurological ROS     GI/Hepatic Neg liver ROS,GERD  ,,  Endo/Other  diabetes, Gestational  Class 3 obesity  Renal/GU negative Renal ROS     Musculoskeletal   Abdominal   Peds  Hematology  (+) Blood dyscrasia, anemia   Anesthesia Other Findings   Reproductive/Obstetrics (+) Pregnancy                             Anesthesia Physical Anesthesia Plan  ASA: 3  Anesthesia Plan: Epidural   Post-op Pain Management:    Induction:   PONV Risk Score and Plan: 2 and Treatment may vary due to age or medical condition  Airway Management Planned: Natural Airway  Additional Equipment:   Intra-op Plan:   Post-operative Plan:   Informed Consent: I have reviewed the patients History and Physical, chart, labs and discussed the procedure including the risks, benefits and alternatives for the proposed anesthesia with the patient or authorized representative who has indicated his/her understanding and acceptance.       Plan Discussed with:   Anesthesia Plan Comments:        Anesthesia Quick Evaluation

## 2023-06-20 NOTE — Progress Notes (Signed)
Sonya Santiago is a 27 y.o. G1P0000 at [redacted]w[redacted]d by LMP admitted for induction of labor due to Gestational diabetes.  Subjective: Coping with contractions  Objective: BP 117/73   Pulse 85   Temp 98 F (36.7 C) (Oral)   Resp 18   Ht 5\' 3"  (1.6 m)   Wt 106.9 kg   LMP 06/18/2022   SpO2 99%   BMI 41.73 kg/m  No intake/output data recorded. No intake/output data recorded.  FHT:  FHR: 155 bpm, variability: moderate,  accelerations:  Present,  decelerations:  Absent UC:   irregular, every 2-4 minutes SVE:   Dilation: 2 Effacement (%): 60 Station: -2 Exam by:: dr Billy Coast AROM-clear  Labs: Lab Results  Component Value Date   WBC 10.9 (H) 06/20/2023   HGB 10.2 (L) 06/20/2023   HCT 32.0 (L) 06/20/2023   MCV 79.0 (L) 06/20/2023   PLT 291 06/20/2023    Assessment / Plan: Induction of labor due to gestational diabetes,  progressing well on pitocin  Labor: Progressing normally Preeclampsia:  no signs or symptoms of toxicity Fetal Wellbeing:  Category I Pain Control:  IV pain meds and Nitrous Oxide I/D:  n/a Anticipated MOD:  NSVD  Lenoard Aden, MD 06/20/2023, 10:00 AM

## 2023-06-21 ENCOUNTER — Encounter (HOSPITAL_COMMUNITY): Payer: Self-pay | Admitting: Obstetrics and Gynecology

## 2023-06-21 ENCOUNTER — Encounter (HOSPITAL_COMMUNITY)
Admission: RE | Disposition: A | Discharge status: Home / Self Care | Payer: Self-pay | Source: Home / Self Care | Attending: Obstetrics and Gynecology

## 2023-06-21 ENCOUNTER — Other Ambulatory Visit: Payer: Self-pay

## 2023-06-21 DIAGNOSIS — Z3A39 39 weeks gestation of pregnancy: Secondary | ICD-10-CM

## 2023-06-21 DIAGNOSIS — O24419 Gestational diabetes mellitus in pregnancy, unspecified control: Secondary | ICD-10-CM

## 2023-06-21 LAB — GLUCOSE, CAPILLARY
Glucose-Capillary: 91 mg/dL (ref 70–99)
Glucose-Capillary: 92 mg/dL (ref 70–99)
Glucose-Capillary: 93 mg/dL (ref 70–99)

## 2023-06-21 SURGERY — Surgical Case
Anesthesia: Epidural | Site: Abdomen

## 2023-06-21 MED ORDER — STERILE WATER FOR IRRIGATION IR SOLN
Status: DC | PRN
  Administered 2023-06-21: 1000 mL

## 2023-06-21 MED ORDER — METHYLERGONOVINE MALEATE 0.2 MG PO TABS: 0.2000 mg | ORAL_TABLET | ORAL | Status: DC | PRN

## 2023-06-21 MED ORDER — SODIUM CHLORIDE 0.9 % IV SOLN
INTRAVENOUS | Status: DC | PRN
  Administered 2023-06-21: 500 mg via INTRAVENOUS

## 2023-06-21 MED ORDER — ZOLPIDEM TARTRATE 5 MG PO TABS: 5.0000 mg | ORAL_TABLET | Freq: Every evening | ORAL | Status: DC | PRN

## 2023-06-21 MED ORDER — DIBUCAINE (PERIANAL) 1 % EX OINT: 1.0000 | TOPICAL_OINTMENT | CUTANEOUS | Status: DC | PRN

## 2023-06-21 MED ORDER — METHYLERGONOVINE MALEATE 0.2 MG/ML IJ SOLN: 0.2000 mg | INTRAMUSCULAR | Status: DC | PRN

## 2023-06-21 MED ORDER — KETOROLAC TROMETHAMINE 30 MG/ML IJ SOLN
INTRAMUSCULAR | Status: AC
Start: 2023-06-21 — End: ?
  Filled 2023-06-21: qty 1

## 2023-06-21 MED ORDER — MORPHINE SULFATE (PF) 0.5 MG/ML IJ SOLN
INTRAMUSCULAR | Status: DC | PRN
  Administered 2023-06-21: 3 mg via EPIDURAL

## 2023-06-21 MED ORDER — NALOXONE HCL 0.4 MG/ML IJ SOLN: 0.4000 mg | INTRAMUSCULAR | Status: DC | PRN

## 2023-06-21 MED ORDER — DEXAMETHASONE SODIUM PHOSPHATE 4 MG/ML IJ SOLN
INTRAMUSCULAR | Status: AC
Start: 2023-06-21 — End: ?
  Filled 2023-06-21: qty 2

## 2023-06-21 MED ORDER — METFORMIN HCL 500 MG PO TABS
500.0000 mg | ORAL_TABLET | Freq: Two times a day (BID) | ORAL | Status: DC
  Administered 2023-06-21 – 2023-06-24 (×6): 500 mg via ORAL
  Filled 2023-06-21 (×6): qty 1

## 2023-06-21 MED ORDER — SCOPOLAMINE 1 MG/3DAYS TD PT72
MEDICATED_PATCH | TRANSDERMAL | Status: DC | PRN
  Administered 2023-06-21: 1 mg via TRANSDERMAL

## 2023-06-21 MED ORDER — FENTANYL CITRATE (PF) 100 MCG/2ML IJ SOLN: 25.0000 ug | INTRAMUSCULAR | Status: DC | PRN

## 2023-06-21 MED ORDER — DIPHENHYDRAMINE HCL 50 MG/ML IJ SOLN: 12.5000 mg | INTRAMUSCULAR | Status: DC | PRN

## 2023-06-21 MED ORDER — MENTHOL 3 MG MT LOZG: 1.0000 | LOZENGE | OROMUCOSAL | Status: DC | PRN

## 2023-06-21 MED ORDER — OXYTOCIN-SODIUM CHLORIDE 30-0.9 UT/500ML-% IV SOLN
2.5000 [IU]/h | INTRAVENOUS | Status: AC
Start: 2023-06-21 — End: 2023-06-22
  Administered 2023-06-21: 2.5 [IU]/h via INTRAVENOUS
  Filled 2023-06-21: qty 500

## 2023-06-21 MED ORDER — LIDOCAINE-EPINEPHRINE (PF) 2 %-1:200000 IJ SOLN
INTRAMUSCULAR | Status: DC | PRN
  Administered 2023-06-21 (×2): 5 mL via EPIDURAL

## 2023-06-21 MED ORDER — BUPIVACAINE HCL (PF) 0.25 % IJ SOLN
INTRAMUSCULAR | Status: DC | PRN
  Administered 2023-06-21: 10 mL

## 2023-06-21 MED ORDER — SCOPOLAMINE 1 MG/3DAYS TD PT72
MEDICATED_PATCH | TRANSDERMAL | Status: AC
  Filled 2023-06-21: qty 1

## 2023-06-21 MED ORDER — ONDANSETRON HCL 4 MG/2ML IJ SOLN
4.0000 mg | Freq: Three times a day (TID) | INTRAMUSCULAR | Status: DC | PRN
  Administered 2023-06-22 – 2023-06-23 (×2): 4 mg via INTRAVENOUS
  Filled 2023-06-21 (×2): qty 2

## 2023-06-21 MED ORDER — CEFAZOLIN SODIUM-DEXTROSE 2-3 GM-%(50ML) IV SOLR
INTRAVENOUS | Status: DC | PRN
Start: 2023-06-21 — End: 2023-06-21
  Administered 2023-06-21: 2 g via INTRAVENOUS

## 2023-06-21 MED ORDER — COCONUT OIL OIL: 1.0000 | TOPICAL_OIL | Status: DC | PRN

## 2023-06-21 MED ORDER — KETOROLAC TROMETHAMINE 30 MG/ML IJ SOLN
30.0000 mg | Freq: Four times a day (QID) | INTRAMUSCULAR | Status: AC
  Administered 2023-06-21 (×3): 30 mg via INTRAVENOUS
  Filled 2023-06-21 (×2): qty 1

## 2023-06-21 MED ORDER — MORPHINE SULFATE (PF) 0.5 MG/ML IJ SOLN
INTRAMUSCULAR | Status: AC
  Filled 2023-06-21: qty 10

## 2023-06-21 MED ORDER — SENNOSIDES-DOCUSATE SODIUM 8.6-50 MG PO TABS
2.0000 | ORAL_TABLET | Freq: Every day | ORAL | Status: DC
  Administered 2023-06-22 – 2023-06-24 (×3): 2 via ORAL
  Filled 2023-06-21 (×3): qty 2

## 2023-06-21 MED ORDER — KETOROLAC TROMETHAMINE 30 MG/ML IJ SOLN: 30.0000 mg | Freq: Four times a day (QID) | INTRAMUSCULAR | Status: AC | PRN

## 2023-06-21 MED ORDER — ACETAMINOPHEN 500 MG PO TABS
1000.0000 mg | ORAL_TABLET | Freq: Four times a day (QID) | ORAL | Status: DC
  Administered 2023-06-21 – 2023-06-24 (×12): 1000 mg via ORAL
  Filled 2023-06-21 (×13): qty 2

## 2023-06-21 MED ORDER — FENTANYL CITRATE (PF) 100 MCG/2ML IJ SOLN
INTRAMUSCULAR | Status: AC
  Filled 2023-06-21: qty 2

## 2023-06-21 MED ORDER — BUPIVACAINE LIPOSOME 1.3 % IJ SUSP
INTRAMUSCULAR | Status: AC
  Filled 2023-06-21: qty 20

## 2023-06-21 MED ORDER — TRANEXAMIC ACID-NACL 1000-0.7 MG/100ML-% IV SOLN
INTRAVENOUS | Status: DC | PRN
Start: 2023-06-21 — End: 2023-06-21
  Administered 2023-06-21: 1000 mg via INTRAVENOUS

## 2023-06-21 MED ORDER — BUPIVACAINE LIPOSOME 1.3 % IJ SUSP
INTRAMUSCULAR | Status: DC | PRN
  Administered 2023-06-21: 20 mL

## 2023-06-21 MED ORDER — DEXAMETHASONE SODIUM PHOSPHATE 10 MG/ML IJ SOLN
INTRAMUSCULAR | Status: AC
  Filled 2023-06-21: qty 1

## 2023-06-21 MED ORDER — BUPIVACAINE HCL (PF) 0.25 % IJ SOLN
INTRAMUSCULAR | Status: AC
  Filled 2023-06-21: qty 10

## 2023-06-21 MED ORDER — ACETAMINOPHEN 325 MG PO TABS: 325.0000 mg | ORAL_TABLET | ORAL | Status: DC | PRN

## 2023-06-21 MED ORDER — SIMETHICONE 80 MG PO CHEW: 80.0000 mg | CHEWABLE_TABLET | ORAL | Status: DC | PRN

## 2023-06-21 MED ORDER — SIMETHICONE 80 MG PO CHEW
80.0000 mg | CHEWABLE_TABLET | Freq: Three times a day (TID) | ORAL | Status: DC
  Administered 2023-06-21 – 2023-06-24 (×9): 80 mg via ORAL
  Filled 2023-06-21 (×9): qty 1

## 2023-06-21 MED ORDER — ONDANSETRON HCL 4 MG/2ML IJ SOLN
INTRAMUSCULAR | Status: AC
  Filled 2023-06-21: qty 4

## 2023-06-21 MED ORDER — LIDOCAINE-EPINEPHRINE (PF) 2 %-1:200000 IJ SOLN
INTRAMUSCULAR | Status: AC
  Filled 2023-06-21: qty 20

## 2023-06-21 MED ORDER — DIPHENHYDRAMINE HCL 25 MG PO CAPS
25.0000 mg | ORAL_CAPSULE | Freq: Four times a day (QID) | ORAL | Status: DC | PRN
  Administered 2023-06-21 (×2): 25 mg via ORAL
  Filled 2023-06-21: qty 1

## 2023-06-21 MED ORDER — DEXAMETHASONE SODIUM PHOSPHATE 4 MG/ML IJ SOLN
INTRAMUSCULAR | Status: DC | PRN
  Administered 2023-06-21: 8 mg via INTRAVENOUS

## 2023-06-21 MED ORDER — IBUPROFEN 600 MG PO TABS
600.0000 mg | ORAL_TABLET | Freq: Four times a day (QID) | ORAL | Status: DC
Start: 2023-06-22 — End: 2023-06-24
  Administered 2023-06-22 – 2023-06-24 (×10): 600 mg via ORAL
  Filled 2023-06-21 (×10): qty 1

## 2023-06-21 MED ORDER — OXYTOCIN-SODIUM CHLORIDE 30-0.9 UT/500ML-% IV SOLN
INTRAVENOUS | Status: DC | PRN
  Administered 2023-06-21: 300 mL via INTRAVENOUS

## 2023-06-21 MED ORDER — SODIUM CHLORIDE 0.9 % IR SOLN
Status: DC | PRN
  Administered 2023-06-21: 1000 mL

## 2023-06-21 MED ORDER — ACETAMINOPHEN 160 MG/5ML PO SOLN: 325.0000 mg | ORAL | Status: DC | PRN

## 2023-06-21 MED ORDER — ACETAMINOPHEN 10 MG/ML IV SOLN: 1000.0000 mg | Freq: Once | INTRAVENOUS | Status: DC | PRN

## 2023-06-21 MED ORDER — MEPERIDINE HCL 25 MG/ML IJ SOLN: 6.2500 mg | INTRAMUSCULAR | Status: DC | PRN

## 2023-06-21 MED ORDER — PHENYLEPHRINE 80 MCG/ML (10ML) SYRINGE FOR IV PUSH (FOR BLOOD PRESSURE SUPPORT)
PREFILLED_SYRINGE | INTRAVENOUS | Status: AC
  Filled 2023-06-21: qty 10

## 2023-06-21 MED ORDER — PRENATAL MULTIVITAMIN CH
1.0000 | ORAL_TABLET | Freq: Every day | ORAL | Status: DC
  Administered 2023-06-22 – 2023-06-24 (×3): 1 via ORAL
  Filled 2023-06-21 (×3): qty 1

## 2023-06-21 MED ORDER — LACTATED RINGERS IV SOLN: INTRAVENOUS | Status: DC | PRN

## 2023-06-21 MED ORDER — ONDANSETRON HCL 4 MG/2ML IJ SOLN
INTRAMUSCULAR | Status: DC | PRN
  Administered 2023-06-21: 4 mg via INTRAVENOUS

## 2023-06-21 MED ORDER — FENTANYL CITRATE (PF) 100 MCG/2ML IJ SOLN
INTRAMUSCULAR | Status: DC | PRN
  Administered 2023-06-21: 100 ug via EPIDURAL

## 2023-06-21 MED ORDER — NALOXONE HCL 4 MG/10ML IJ SOLN: 1.0000 ug/kg/h | INTRAVENOUS | Status: DC | PRN

## 2023-06-21 MED ORDER — OXYCODONE HCL 5 MG PO TABS
5.0000 mg | ORAL_TABLET | ORAL | Status: DC | PRN
  Administered 2023-06-22 – 2023-06-23 (×4): 10 mg via ORAL
  Administered 2023-06-23: 5 mg via ORAL
  Administered 2023-06-23: 10 mg via ORAL
  Administered 2023-06-24 (×2): 5 mg via ORAL
  Filled 2023-06-21: qty 1
  Filled 2023-06-21: qty 2
  Filled 2023-06-21 (×2): qty 1
  Filled 2023-06-21: qty 2
  Filled 2023-06-21: qty 1
  Filled 2023-06-21 (×2): qty 2
  Filled 2023-06-21: qty 1

## 2023-06-21 MED ORDER — SCOPOLAMINE 1 MG/3DAYS TD PT72: 1.0000 | MEDICATED_PATCH | Freq: Once | TRANSDERMAL | Status: DC

## 2023-06-21 MED ORDER — DIPHENHYDRAMINE HCL 25 MG PO CAPS
25.0000 mg | ORAL_CAPSULE | ORAL | Status: DC | PRN
  Filled 2023-06-21: qty 1

## 2023-06-21 MED ORDER — WITCH HAZEL-GLYCERIN EX PADS: 1.0000 | MEDICATED_PAD | CUTANEOUS | Status: DC | PRN

## 2023-06-21 MED ORDER — SODIUM CHLORIDE 0.9% FLUSH
3.0000 mL | INTRAVENOUS | Status: DC | PRN
  Administered 2023-06-23: 3 mL via INTRAVENOUS

## 2023-06-21 MED ORDER — LACTATED RINGERS IV BOLUS
500.0000 mL | Freq: Once | INTRAVENOUS | Status: AC
  Administered 2023-06-21: 500 mL via INTRAVENOUS

## 2023-06-21 SURGICAL SUPPLY — 35 items
BENZOIN TINCTURE PRP APPL 2/3 (GAUZE/BANDAGES/DRESSINGS) IMPLANT
CHLORAPREP W/TINT 26 (MISCELLANEOUS) ×2 IMPLANT
CLAMP UMBILICAL CORD (MISCELLANEOUS) ×1 IMPLANT
CLOTH BEACON ORANGE TIMEOUT ST (SAFETY) ×1 IMPLANT
DRSG OPSITE POSTOP 4X10 (GAUZE/BANDAGES/DRESSINGS) ×1 IMPLANT
ELECT REM PT RETURN 9FT ADLT (ELECTROSURGICAL) ×1
ELECTRODE REM PT RTRN 9FT ADLT (ELECTROSURGICAL) ×1 IMPLANT
EXTRACTOR VACUUM KIWI (MISCELLANEOUS) IMPLANT
GLOVE BIOGEL PI IND STRL 7.0 (GLOVE) ×1 IMPLANT
GLOVE SURG LTX SZ8 (GLOVE) ×1 IMPLANT
GOWN STRL REUS W/TWL LRG LVL3 (GOWN DISPOSABLE) ×2 IMPLANT
KIT ABG SYR 3ML LUER SLIP (SYRINGE) IMPLANT
NDL HYPO 25X5/8 SAFETYGLIDE (NEEDLE) IMPLANT
NDL SPNL 20GX3.5 QUINCKE YW (NEEDLE) IMPLANT
NEEDLE HYPO 22GX1.5 SAFETY (NEEDLE) ×1 IMPLANT
NEEDLE HYPO 25X5/8 SAFETYGLIDE (NEEDLE) IMPLANT
NEEDLE SPNL 20GX3.5 QUINCKE YW (NEEDLE) IMPLANT
NS IRRIG 1000ML POUR BTL (IV SOLUTION) ×1 IMPLANT
PACK C SECTION WH (CUSTOM PROCEDURE TRAY) ×1 IMPLANT
RETAINER VISCERAL (MISCELLANEOUS) IMPLANT
RTRCTR C-SECT PINK 25CM LRG (MISCELLANEOUS) IMPLANT
STRIP CLOSURE SKIN 1/2X4 (GAUZE/BANDAGES/DRESSINGS) IMPLANT
SUT MNCRL 0 VIOLET CTX 36 (SUTURE) ×2 IMPLANT
SUT MNCRL AB 3-0 PS2 27 (SUTURE) IMPLANT
SUT MON AB 2-0 CT1 27 (SUTURE) ×1 IMPLANT
SUT MON AB-0 CT1 36 (SUTURE) ×2 IMPLANT
SUT PLAIN 0 NONE (SUTURE) IMPLANT
SUT PLAIN 2 0 XLH (SUTURE) IMPLANT
SUT PLAIN ABS 2-0 CT1 27XMFL (SUTURE) IMPLANT
SUT VIC AB 4-0 KS 27 (SUTURE) IMPLANT
SUTURE PLAIN GUT 2.0 ETHICON (SUTURE) IMPLANT
SYR CONTROL 10ML LL (SYRINGE) ×1 IMPLANT
TOWEL OR 17X24 6PK STRL BLUE (TOWEL DISPOSABLE) ×1 IMPLANT
TRAY FOLEY W/BAG SLVR 14FR LF (SET/KITS/TRAYS/PACK) ×1 IMPLANT
WATER STERILE IRR 1000ML POUR (IV SOLUTION) ×1 IMPLANT

## 2023-06-21 NOTE — Progress Notes (Addendum)
Patient ID: Sonya Santiago, female   DOB: 1996/07/15, 27 y.o.   MRN: [id number] Called to review tracing 0544 BP (!) 111/57 (BP Location: Left Arm)   Pulse 81   Temp 98.7 F (37.1 C) (Oral)   Resp 16   Ht 5\' 3"  (1.6 m)   Wt 106.9 kg   LMP 06/18/2022   SpO2 100%   BMI 41.73 kg/m   Category 2 tracing over last hour now category 1. FHR 160s with BTBV now 6-25 and no recurrent decels noted. Will reassess pt to restart Pitocin after Intrauterine resuscitation vs delivery.  Called to fu on tracing 0615 FHR 150s with no recurrent decels, rare accel BTBV now 5-25.  Called [facility] charge enroute at [phone] to make her Catering manager) aware of pt status and need for csection.  Assessed pt [id number]. VE : 2/100/-1 Fetal accel with scalp stimulation. FHT 150-160 with BTBV >5, no recurrent decels, occ variable Contractions irregular. Csection called due to persistent Category 2 tracing.  [facility] charge nurse notes that csection cannot be done until after shift change at 0730.    Procedure explained to pt  Consent done. Risks of anesthesia , infection, bleeding with injury to surrounding organs with need for repair discussed.  Pt acknowledges and will proceed.  Notified 0730 OR instruments "bloody" . Case delayed.

## 2023-06-21 NOTE — Transfer of Care (Signed)
Immediate Anesthesia Transfer of Care Note  Patient: Sonya Santiago  Procedure(s) Performed: CESAREAN SECTION (Abdomen)  Patient Location: PACU  Anesthesia Type:Epidural  Level of Consciousness: awake, alert , and oriented  Airway & Oxygen Therapy: Patient Spontanous Breathing  Post-op Assessment: Report given to RN and Post -op Vital signs reviewed and stable  Post vital signs: Reviewed and stable  Last Vitals:  Vitals Value Taken Time  BP 116/71 06/21/23 0905  Temp 36.8 C 06/21/23 0905  Pulse 92 06/21/23 0909  Resp 16 06/21/23 0909  SpO2 99 % 06/21/23 0909  Vitals shown include unfiled device data.  Last Pain:  Vitals:   06/21/23 0905  TempSrc: Oral  PainSc:          Complications: No notable events documented.

## 2023-06-21 NOTE — Anesthesia Postprocedure Evaluation (Signed)
Anesthesia Post Note  Patient: Sonya Santiago  Procedure(s) Performed: CESAREAN SECTION (Abdomen)     Patient location during evaluation: Mother Baby Anesthesia Type: Epidural Level of consciousness: awake and alert Pain management: pain level controlled Vital Signs Assessment: post-procedure vital signs reviewed and stable Respiratory status: spontaneous breathing, nonlabored ventilation and respiratory function stable Cardiovascular status: stable Postop Assessment: no headache, no backache and epidural receding Anesthetic complications: no   No notable events documented.  Last Vitals:  Vitals:   06/21/23 0930 06/21/23 0945  BP: 112/76   Pulse: 92 77  Resp: (!) 22 17  Temp:    SpO2: 98% 97%    Last Pain:  Vitals:   06/21/23 0945  TempSrc:   PainSc: 0-No pain                 [name]

## 2023-06-21 NOTE — Op Note (Signed)
Cesarean Section Procedure Note  Indications: non-reassuring fetal status  Pre-operative Diagnosis: 39 week 3 day pregnancy.  Post-operative Diagnosis: same  Surgeon: Lenoard Aden   Assistants: Renae Fickle, CNM  Anesthesia: Epidural anesthesia and Spinal anesthesia  ASA Class: 2  Procedure Details  The patient was seen in the Holding Room. The risks, benefits, complications, treatment options, and expected outcomes were discussed with the patient.  The patient concurred with the proposed plan, giving informed consent. The risks of anesthesia, infection, bleeding and possible injury to other organs discussed. Injury to bowel, bladder, or ureter with possible need for repair discussed. Possible need for transfusion with secondary risks of hepatitis or HIV acquisition discussed. Post operative complications to include but not limited to DVT, PE and Pneumonia noted. The site of surgery properly noted/marked. The patient was taken to Operating Room # A, identified as Tenzin Darrington and the procedure verified as C-Section Delivery. A Time Out was held and the above information confirmed.  After induction of anesthesia, the patient was draped and prepped in the usual sterile manner. A Pfannenstiel incision was made and carried down through the subcutaneous tissue to the fascia. Fascial incision was made and extended transversely using Mayo scissors. The fascia was separated from the underlying rectus tissue superiorly and inferiorly. The peritoneum was identified and entered. Peritoneal incision was extended longitudinally. Alexis retractor placed.The utero-vesical peritoneal reflection was incised transversely and the bladder flap was bluntly freed from the lower uterine segment. A low transverse uterine incision(Kerr hysterotomy) was made. Delivered from OA presentation was a  female with Apgar scores of 8 at one minute and 9 at five minutes. Bulb suctioning gently performed. Neonatal team in attendance.After  the umbilical cord was clamped and cut cord blood was obtained for evaluation. The placenta was removed intact and appeared normal. The uterus was curetted with a dry lap pack. Good hemostasis was noted.The uterine outline, tubes and ovaries appeared normal. The uterine incision was closed with running locked sutures of 0 Monocryl x 1 layers. Hemostasis was observed. Alexis removed.The fascia was then reapproximated with running sutures of 0 Monocryl. The skin was reapproximated with 3-0 monocryl after [place] closure with 2-0 plain.  Instrument, sponge, and needle counts were correct prior the abdominal closure and at the conclusion of the case.   Findings: FTLM, OA, anterior placenta  Estimated Blood Loss:   500         Drains: foley                 Specimens: placenta                 Complications:  None; patient tolerated the procedure well.         Disposition: PACU - hemodynamically stable.         Condition: stable  Attending Attestation: I performed the procedure.

## 2023-06-21 NOTE — Lactation Note (Signed)
This note was copied from a baby's chart. Lactation Consultation Note  Patient Name: Sonya Santiago [id number] Date: 06/21/2023 Age:29 hours Reason for consult: Initial assessment;Primapara;1st time breastfeeding;Term;Maternal endocrine disorder  P1- MOB is GDM and infant was born via IVF due to paternal infertility, not maternal (per MOB). MOB has attempted to latch infant, but has been unsuccessful so she has been offering donor milk. MOB was currently offering donor milk when LC entered room. MOB stated that infant is too sleepy to latch and often slips off when she attempts latching. MOB placed infant in the cradle hold on the left breast to demonstrate this to [facility]. Infant was belly up, so LC demonstrated belly to belly and nose to nipple for latching. LC also encouraged MOB to use cross cradle instead of cradle.  Infant was alert and attempted to latch, but he was tongue thrusting and uncoordinated. MOB decided to offer him more donor milk instead of continuing to attempt a latch. Infant drank 7 mL of DBM, but was chomping and did not exhibit a rhythmic sucking motion. LC encouraged MOB to continue offering breast first before offering donor milk.  LC set up the DEBP with size 18 mm flanges and demonstrated how to use it. MOB verbalized understanding. MOB stated that she was too tired to pump at this moment. LC reassured MOB that this was okay, but to try to pump after every feeding to assist with stimulation. MOB verbalized agreement.  LC reviewed feeding infant 8-12x in 24 hrs, not allowing infant to go over 3 hrs without a feeding, CDC milk storage guidelines and LC services handout. LC encouraged MOB to call for further support as needed.  Maternal Data Has patient been taught Hand Expression?: No Does the patient have breastfeeding experience prior to this delivery?: No  Feeding Mother's Current Feeding Choice: Breast Milk and Donor Milk Nipple Type: Slow - flow  LATCH Score Latch:  Too sleepy or reluctant, no latch achieved, no sucking elicited.  Audible Swallowing: None  Type of Nipple: Everted at rest and after stimulation  Comfort (Breast/Nipple): Soft / non-tender  Hold (Positioning): Assistance needed to correctly position infant at breast and maintain latch.  LATCH Score: 5   Lactation Tools Discussed/Used Tools: Pump;Flanges Flange Size: 18 (needs 16 mm flange) Breast pump type: Double-Electric Breast Pump;Manual Pump Education: Setup, frequency, and cleaning;Milk Storage Reason for Pumping: donor milk usage Pumping frequency: 15-20 min every 2-3 hrs  Interventions Interventions: Breast feeding basics reviewed;Assisted with latch;Breast compression;Adjust position;Support pillows;Position options;Hand pump;DEBP;Education;Pace feeding;LC Services brochure  Discharge Discharge Education: Warning signs for feeding baby Pump: DEBP;Hands Free;Personal  Consult Status Consult Status: Follow-up Date: 06/22/23 Follow-up type: In-patient    Dema Severin BS, IBCLC 06/21/2023, 8:24 PM

## 2023-06-22 LAB — CBC
HCT: 24.2 % — ABNORMAL LOW (ref 36.0–46.0)
Hemoglobin: 7.7 g/dL — ABNORMAL LOW (ref 12.0–15.0)
MCH: 25.8 pg — ABNORMAL LOW (ref 26.0–34.0)
MCHC: 31.8 g/dL (ref 30.0–36.0)
MCV: 81.2 fL (ref 80.0–100.0)
Platelets: 230 10*3/uL (ref 150–400)
RBC: 2.98 MIL/uL — ABNORMAL LOW (ref 3.87–5.11)
RDW: 15 % (ref 11.5–15.5)
WBC: 14.3 10*3/uL — ABNORMAL HIGH (ref 4.0–10.5)
nRBC: 0 % (ref 0.0–0.2)

## 2023-06-22 LAB — ABO/RH: ABO/RH(D): O POS

## 2023-06-22 LAB — PREPARE RBC (CROSSMATCH)

## 2023-06-22 MED ORDER — ACETAMINOPHEN 325 MG PO TABS: 650.0000 mg | ORAL_TABLET | Freq: Once | ORAL | Status: DC

## 2023-06-22 MED ORDER — HYDROMORPHONE HCL 1 MG/ML IJ SOLN
1.0000 mg | INTRAMUSCULAR | Status: DC | PRN
  Administered 2023-06-22 – 2023-06-23 (×3): 1 mg via INTRAVENOUS
  Filled 2023-06-22 (×3): qty 1

## 2023-06-22 MED ORDER — SODIUM CHLORIDE 0.9% IV SOLUTION: Freq: Once | INTRAVENOUS | Status: DC

## 2023-06-22 MED ORDER — DIPHENHYDRAMINE HCL 25 MG PO CAPS
25.0000 mg | ORAL_CAPSULE | Freq: Once | ORAL | Status: AC
  Administered 2023-06-22: 25 mg via ORAL
  Filled 2023-06-22: qty 1

## 2023-06-22 NOTE — Social Work (Signed)
CSW received consult for hx of Anxiety and Depression and previous suicide attempt.  CSW met with MOB to offer support and complete assessment.  CSW entered the room and observed MOB getting back into bed and FOB at bedside. CSW introduced self, CSW role and reason for visit, MOB was agreeable to visit and allowed FOB to remain in the room. CSW    CSW provided education regarding the baby blues period vs. perinatal mood disorders, discussed treatment and gave resources for mental health follow up if concerns arise.  CSW recommends self-evaluation during the postpartum time period using the New Mom Checklist from Postpartum Progress and encouraged MOB to contact a medical professional if symptoms are noted at any time.   CSW provided review of Sudden Infant Death Syndrome (SIDS) precautions.   CSW identifies no further need for intervention and no barriers to discharge at this time.

## 2023-06-22 NOTE — Progress Notes (Signed)
Subjective: Postpartum Day One: Cesarean Delivery Patient reports feeling very dizzy this morning with trying to ambulate in the hall. Feels drained and fatigued, concerned about anemia. States she took iron throughout the pregnancy but feels it did not help. Vaginal bleeding has been minimal. Feeling very sore at incision but pain medication does help. No N/V, tolerating regular diet. Has been able to void without difficulties. No CP, SOB, or HA.   Baby boy doing well at bedside.   Objective: Patient Vitals for the past 24 hrs:  BP Temp Temp src Pulse Resp SpO2  06/22/23 0502 116/60 98.4 F (36.9 C) Oral 77 18 100 %  06/22/23 0225 -- -- -- -- 18 100 %  06/22/23 0027 (!) 101/56 98.2 F (36.8 C) Oral 79 16 100 %  06/21/23 2240 -- -- -- -- 18 99 %  06/21/23 2027 (!) 103/59 98.2 F (36.8 C) Oral 69 18 99 %  06/21/23 1836 -- -- -- -- 18 99 %  06/21/23 1723 -- 98.1 F (36.7 C) Oral -- -- 99 %  06/21/23 1635 -- -- -- -- 18 99 %  06/21/23 1532 113/78 -- -- 82 18 99 %  06/21/23 1415 -- -- -- -- 18 99 %    Intake/Output Summary (Last 24 hours) at 06/22/2023 1234 Last data filed at 06/22/2023 0900 Gross per 24 hour  Intake 620 ml  Output 1365 ml  Net -745 ml     Physical Exam:  General: alert and pale Lochia: appropriate Uterine Fundus: firm Incision: healing well, no significant drainage DVT Evaluation: No evidence of DVT seen on physical exam.  Recent Labs    06/20/23 0018 06/22/23 0553  HGB 10.2* 7.7*  HCT 32.0* 24.2*    Assessment/Plan: Status post Cesarean section. Postoperative course complicated by symptomatic acute on chronic blood loss anemia   Continue current care inpatient. Keerthi Glover G1P1001 POD#1 sp primary cesarean section at [redacted]w[redacted]d for NRFHT 1. Symptomatic blood loss anemia: POD1 Hgb 7.7 today down from 10.2. VB appropriate and VSS but given patient symptoms we discussed options of blood transfusion and IV Fe with review of risks and benefits of each. Pt  voices concern about not responding to IV Fe enough and would like to proceed with blood transfusion at this time. Pt consented at bedside and 1U PRBC ordered. Plan for repeat CBC tmw AM (ordered) and consider additional IV Fe at that time as needed 2. Continue routine postop pain regimen 3. Encourage ambulation as tolerated 4. LC support PRN 5. RH Pos, RI  Continue inpatient care  Brittnei Jagiello A Tommie Dejoseph 06/22/2023, 12:33 PM

## 2023-06-22 NOTE — Lactation Note (Signed)
This note was copied from a baby's chart. Lactation Consultation Note  Patient Name: Boy Kalandra Loye ZOXWR'[id number] Date: 06/22/2023 Age:30 hours  LC was fixing to go into rm. When RN was coming out stated to [facility] that mom is in a lot of pain and had to get pain medication. LC will try later to see mom.   Maternal Data    Feeding    LATCH Score                    Lactation Tools Discussed/Used    Interventions    Discharge    Consult Status      Charyl Dancer 06/22/2023, 9:08 PM

## 2023-06-23 LAB — CBC
HCT: 24.3 % — ABNORMAL LOW (ref 36.0–46.0)
Hemoglobin: 7.6 g/dL — ABNORMAL LOW (ref 12.0–15.0)
MCH: 25.4 pg — ABNORMAL LOW (ref 26.0–34.0)
MCHC: 31.3 g/dL (ref 30.0–36.0)
MCV: 81.3 fL (ref 80.0–100.0)
Platelets: 222 10*3/uL (ref 150–400)
RBC: 2.99 MIL/uL — ABNORMAL LOW (ref 3.87–5.11)
RDW: 15 % (ref 11.5–15.5)
WBC: 13.6 10*3/uL — ABNORMAL HIGH (ref 4.0–10.5)
nRBC: 0 % (ref 0.0–0.2)

## 2023-06-23 LAB — TYPE AND SCREEN
ABO/RH(D): O POS
Antibody Screen: NEGATIVE
Unit division: 0

## 2023-06-23 LAB — BPAM RBC
Blood Product Expiration Date: 202412232359
ISSUE DATE / TIME: 202412041725
Unit Type and Rh: 5100

## 2023-06-23 MED ORDER — IRON SUCROSE 20 MG/ML IV SOLN
500.0000 mg | Freq: Once | INTRAVENOUS | Status: AC
  Administered 2023-06-23: 500 mg via INTRAVENOUS
  Filled 2023-06-23: qty 25

## 2023-06-23 NOTE — Progress Notes (Signed)
Postpartum Progress Note  POD#2 s/p PCS for NRFHT  S: Patient seen and examined at bedside. Reports feeling overall well. Pain well controlled with medications, ambulating, tolerating regular diet, voiding without issue. Passing flatus, no bowel movement yet. Denies fever, chills, chest pain, shortness of breath.   Received 1u pRBC yesterday for symptomatic anemia w/Hgb 7.7. Reports symptomatic improvement after blood - no dizziness with ambulating today and feeling less tired.   Feeding: plans to breastfeed, plans to bottle feed Circ: S/p circumcision  O:  Vitals:   06/22/23 2100 06/23/23 0534  BP: 119/71 111/69  Pulse: 95 84  Resp: 18 18  Temp: 98.3 F (36.8 C) 97.8 F (36.6 C)  SpO2: 100% 100%    PE:  GA: well appearing, NAD CV: RRR, normal S1, S2 Lungs: CTAB Abd: soft, appropriately tender, fundus firm below umbilicus Inc: dressing in place, clean/dry/intact Peri: moderate lochia Ext: no TTP, +1 non-pitting edema  Labs:  Lab Results  Component Value Date   WBC 13.6 (H) 06/23/2023   HGB 7.6 (L) 06/23/2023   HCT 24.3 (L) 06/23/2023   MCV 81.3 06/23/2023   PLT 222 06/23/2023   Lab Results  Component Value Date   CREATININE 0.60 03/12/2023    A/P:  27 y.o.yo G1P1001 POD#2 s/p PCS  for NRFHT(EBL ) s/p 1u pRBC yesterday with GDMA2, doing well and progressing appropriately. Vitals within normal limits. Physical exam benign. Labs notable for continued anemia - Hgb 7.6 from 7.7 yesterday after 1u pRBC. Plan as follows:   #Routine OB - Regular diet, HLIV - ERAS for pain control  - Rh positive - DVT ppx: ambulating and SCDs - BCM: Unsure  #Neonate - S/p circumcision -plans to breastfeed, plans to bottle feed   #Anemia - Hgb preop 10.2 > POD#1 7.7 > s/p 1u pRBC > POD#2 7.6  - For IV iron infusion today - Plan repeat CBC tomorrow AM  Marlene Bast, MD

## 2023-06-24 LAB — CBC WITH DIFFERENTIAL/PLATELET
Abs Immature Granulocytes: 0.08 10*3/uL — ABNORMAL HIGH (ref 0.00–0.07)
Basophils Absolute: 0 10*3/uL (ref 0.0–0.1)
Basophils Relative: 0 %
Eosinophils Absolute: 0.4 10*3/uL (ref 0.0–0.5)
Eosinophils Relative: 5 %
HCT: 24.7 % — ABNORMAL LOW (ref 36.0–46.0)
Hemoglobin: 7.8 g/dL — ABNORMAL LOW (ref 12.0–15.0)
Immature Granulocytes: 1 %
Lymphocytes Relative: 19 %
Lymphs Abs: 1.7 10*3/uL (ref 0.7–4.0)
MCH: 25.5 pg — ABNORMAL LOW (ref 26.0–34.0)
MCHC: 31.6 g/dL (ref 30.0–36.0)
MCV: 80.7 fL (ref 80.0–100.0)
Monocytes Absolute: 0.6 10*3/uL (ref 0.1–1.0)
Monocytes Relative: 6 %
Neutro Abs: 6.5 10*3/uL (ref 1.7–7.7)
Neutrophils Relative %: 69 %
Platelets: 246 10*3/uL (ref 150–400)
RBC: 3.06 MIL/uL — ABNORMAL LOW (ref 3.87–5.11)
RDW: 15 % (ref 11.5–15.5)
WBC: 9.3 10*3/uL (ref 4.0–10.5)
nRBC: 0 % (ref 0.0–0.2)

## 2023-06-24 MED ORDER — OXYCODONE HCL 5 MG PO TABS: 5.0000 mg | ORAL_TABLET | ORAL | 0 refills | Status: DC | PRN

## 2023-06-24 NOTE — Lactation Note (Signed)
This note was copied from a baby's chart. Lactation Consultation Note  Patient Name: Sonya Santiago Date: 06/24/2023 Age:29 hours Reason for consult: Follow-up assessment;Primapara;Term Mom was walking in room holding baby when [facility] came into rm. Mom stated baby has been fussy. LC mentioned cluster feeding. Mom stated yes but she is pumping but only getting a drop. Mom is supplementing w/DBM. Mom is BF as well. Mom placed baby in bassinet and stated she was going to try to rest as well that she is exhausted.  Asked mom to call for [facility] for next feeding if she would like.   Maternal Data    Feeding Mother's Current Feeding Choice: Breast Milk and Donor Milk  LATCH Score                    Lactation Tools Discussed/Used    Interventions    Discharge    Consult Status Consult Status: Follow-up Date: 06/24/23 Follow-up type: In-patient    Farren Landa, Diamond Nickel 06/24/2023, 12:01 AM

## 2023-06-24 NOTE — Progress Notes (Signed)
Postpartum Progress Note  POD#3 s/p PCS for NRFHT  S: Patient seen and examined at bedside. Reports feeling overall well. Pain well controlled with medications, ambulating, tolerating regular diet, voiding without issue. Passing flatus, no bowel movement yet. Denies fever, chills, chest pain, shortness of breath.   Received 1u pRBC  for symptomatic anemia w/Hgb 7.7. Reports symptomatic improvement after blood - no dizziness with ambulating today and feeling less tired.   Feeding: plans to breastfeed, plans to bottle feed Circ: S/p circumcision  O:  Vitals:   06/23/23 2206 06/24/23 0615  BP: 110/69 129/76  Pulse: 84 96  Resp: 17 18  Temp: 98.3 F (36.8 C) 98.3 F (36.8 C)  SpO2: 99% 99%    PE:  GA: well appearing, NAD CV: RRR, normal S1, S2 Lungs: CTAB Abd: soft, appropriately tender, fundus firm below umbilicus Inc: dressing in place, clean/dry/intact Peri: moderate lochia Ext: no TTP, +1 non-pitting edema  Labs:  Lab Results  Component Value Date   WBC 9.3 06/24/2023   HGB 7.8 (L) 06/24/2023   HCT 24.7 (L) 06/24/2023   MCV 80.7 06/24/2023   PLT 246 06/24/2023   Lab Results  Component Value Date   CREATININE 0.60 03/12/2023    A/P:  27 y.o.yo G1P1001 POD#3s/p PCS  for NRFHT(EBL ) s/p 1u pRBC yesterday with GDMA2, doing well and progressing appropriately. Vitals within normal limits. Physical exam benign. Labs notable for continued anemia - Hgb 7.6 from 7.7 yesterday after 1u pRBC. Plan as follows:   #Routine OB - Regular diet, HLIV - ERAS for pain control  - Rh positive - DVT ppx: ambulating and SCDs - BCM: Unsure  #Neonate - S/p circumcision -plans to breastfeed, plans to bottle feed   #Anemia - Hgb preop 10.2 > POD#1 7.7 > s/p 1u pRBC > POD#2 7.6  - stable  DC home

## 2023-06-24 NOTE — Lactation Note (Signed)
This note was copied from a baby's chart. Lactation Consultation Note  Patient Name: Sonya Santiago Date: 06/24/2023 Age:29 hours Reason for consult: Follow-up assessment;1st time breastfeeding;Maternal endocrine disorder  P1, Baby has been breastfeeding and supplementing with donor milk but mother states baby will not stay on the breast.  Applied #20NS, prefilled with donor milk and baby was able to sustain latch.  Encouraged mother to post pump q 3 hours and give volume pumped back with the difference with formula.    Reviewed engorgement care and monitoring voids/stools. Feed on demand with cues.  Goal 8-12+ times per day after first 24 hrs.  Place baby STS if not cueing.  Call for latch assistance as needed.  Maternal Data Has patient been taught Hand Expression?: Yes Does the patient have breastfeeding experience prior to this delivery?: No  Feeding Mother's Current Feeding Choice: Breast Milk and Formula  LATCH Score Latch: Grasps breast easily, tongue down, lips flanged, rhythmical sucking.  Audible Swallowing: A few with stimulation  Type of Nipple: Everted at rest and after stimulation  Comfort (Breast/Nipple): Soft / non-tender  Hold (Positioning): Assistance needed to correctly position infant at breast and maintain latch.  LATCH Score: 8   Lactation Tools Discussed/Used Tools: Pump;Nipple Shields Nipple shield size: 20  Interventions Interventions: Breast feeding basics reviewed;Assisted with latch;Skin to skin;DEBP;Education  Discharge Discharge Education: Engorgement and breast care;Warning signs for feeding baby Pump: DEBP;Hands Free;Personal  Consult Status Consult Status: Complete Date: 06/24/23    Dahlia Byes [facility] 06/24/2023, 8:59 AM

## 2023-06-24 NOTE — Discharge Summary (Signed)
OB Discharge Summary  Patient Name: Sonya Santiago DOB: Mar 10, 1996 MRN: [id number]  Date of admission: 06/20/2023 Delivering provider: Olivia Mackie  Admitting diagnosis: Encounter for induction of labor [Z34.90] Intrauterine pregnancy: [redacted]w[redacted]d     Secondary diagnosis: Patient Active Problem List   Diagnosis Date Noted   Postpartum care following cesarean delivery 12/3 06/21/2023   Cesarean delivery - failed IOL, FITL 06/21/2023   GDM, class A2 06/21/2023   Encounter for induction of labor 06/20/2023   Generalized anxiety disorder 06/23/2022   Class 3 severe obesity due to excess calories without serious comorbidity with body mass index (BMI) of 45.0 to 49.9 in adult [facility]) 11/15/2018   Additional problems:n   Date of discharge: 06/24/2023   Discharge diagnosis: Principal Problem:   Postpartum care following cesarean delivery 12/3 Active Problems:   Class 3 severe obesity due to excess calories without serious comorbidity with body mass index (BMI) of 45.0 to 49.9 in adult [facility])   Generalized anxiety disorder   Encounter for induction of labor   Cesarean delivery - failed IOL, FITL   GDM, class A2                                                              Post partum procedures:blood transfusion  Augmentation: Pitocin and Cytotec Pain control: Epidural Laceration:  Episiotomy:  Complications: None  Hospital course:  Onset of Labor With Unplanned C/S   27 y.o. yo G1P1001 at [redacted]w[redacted]d was admitted in Latent Labor on 06/20/2023. Patient had a labor course significant for nrfhr. The patient went for cesarean section due to Non-Reassuring FHR. Delivery details as follows: Membrane Rupture Time/Date: 9:49 AM,06/20/2023  Delivery Method:C-Section, Low Transverse Operative Delivery:N/A Details of operation can be found in separate operative note. Patient had a postpartum course complicated byna.  She is ambulating,tolerating a regular diet, passing flatus, and urinating well.   Patient is discharged home in stable condition 06/24/23.  Newborn Data: Birth date:06/21/2023 Birth time:8:19 AM Gender:Female Living status:Living Apgars:8 ,9  Weight:3280 g  Subjective: wd wn Physical exam  Vitals:   06/23/23 0534 06/23/23 1441 06/23/23 2206 06/24/23 0615  BP: 111/69 130/86 110/69 129/76  Pulse: 84 97 84 96  Resp: 18 17 17 18   Temp: 97.8 F (36.6 C) 98.4 F (36.9 C) 98.3 F (36.8 C) 98.3 F (36.8 C)  TempSrc: Oral Oral Oral Oral  SpO2: 100% 99% 99% 99%  Weight:      Height:       General: alert, cooperative, and no distress Lochia: appropriate Uterine Fundus: firm Incision: Healing well with no significant drainage Perineum: repair na, tr edema DVT Evaluation: No evidence of DVT seen on physical exam. Labs: Lab Results  Component Value Date   WBC 9.3 06/24/2023   HGB 7.8 (L) 06/24/2023   HCT 24.7 (L) 06/24/2023   MCV 80.7 06/24/2023   PLT 246 06/24/2023      Latest Ref Rng & Units 03/12/2023   10:24 PM  CMP  Glucose 70 - 99 mg/dL 865   BUN 6 - 20 mg/dL 6   Creatinine 7.84 - 6.96 mg/dL 2.95   Sodium 284 - 132 mmol/L 136   Potassium 3.5 - 5.1 mmol/L 3.4   Chloride 98 - 111 mmol/L 104   CO2 22 -  32 mmol/L 21   Calcium 8.9 - 10.3 mg/dL 9.6   Total Protein 6.5 - 8.1 g/dL 6.3   Total Bilirubin 0.3 - 1.2 mg/dL 0.5   Alkaline Phos 38 - 126 U/L 65   AST 15 - 41 U/L 16   ALT 0 - 44 U/L 13       06/22/2023    7:00 AM  Edinburgh Postnatal Depression Scale Screening Tool  I have been able to laugh and see the funny side of things. 0  I have looked forward with enjoyment to things. 0  I have blamed myself unnecessarily when things went wrong. 1  I have been anxious or worried for no good reason. 1  I have felt scared or panicky for no good reason. 1  Things have been getting on top of me. 1  I have been so unhappy that I have had difficulty sleeping. 0  I have felt sad or miserable. 0  I have been so unhappy that I have been crying. 0  The  thought of harming myself has occurred to me. 0  Edinburgh Postnatal Depression Scale Total 4   Vaccines: TDaP         na         Flu na Discharge instruction:  per After Visit Summary,  [facility] OB booklet and  "Understanding Mother & Baby Care" hospital booklet After Visit Meds:  Allergies as of 06/24/2023   No Known Allergies      Medication List     STOP taking these medications    aspirin EC 81 MG tablet   metoprolol tartrate 25 MG tablet Commonly known as: LOPRESSOR   ondansetron 4 MG tablet Commonly known as: Zofran   prenatal multivitamin Tabs tablet       TAKE these medications    metFORMIN 500 MG tablet Commonly known as: GLUCOPHAGE Take 500 mg by mouth 2 (two) times daily with a meal.   oxyCODONE 5 MG immediate release tablet Commonly known as: Oxy IR/ROXICODONE Take 1-2 tablets (5-10 mg total) by mouth every 4 (four) hours as needed for moderate pain (pain score 4-6).       Diet: routine diet Activity: Advance as tolerated. Pelvic rest for 6 weeks.  Postpartum contraception: TBA in office Newborn Data: Live born female  Birth Weight: 7 lb 3.7 oz (3280 g) APGAR: 8, 9  Newborn Delivery   Birth date/time: 06/21/2023 08:19:34 Delivery type: C-Section, Low Transverse Trial of labor: Yes C-section categorization: Primary     named na Baby Feeding: Breast Disposition:home with mother Circumcision: done Delivery Report: Review the Delivery Report for details.   Follow up:  Signed: Milbert Coulter, MSN 06/24/2023, 9:31 AM

## 2023-07-02 ENCOUNTER — Telehealth (HOSPITAL_COMMUNITY): Payer: Self-pay

## 2023-07-02 NOTE — Telephone Encounter (Signed)
07/02/2023 0935  Name: Sonya Santiago MRN: [id number] DOB: 09/17/1995  Reason for Call:  Transition of Care Hospital Discharge Call  Contact Status: Patient Contact Status: Message  Language assistant needed:          Follow-Up Questions:    Inocente Salles Postnatal Depression Scale:  In the Past 7 Days:    PHQ2-9 Depression Scale:     Discharge Follow-up:    Post-discharge interventions: NA  Signature  Signe Colt

## 2023-10-27 ENCOUNTER — Ambulatory Visit: Admitting: Family

## 2023-10-27 VITALS — BP 108/76 | HR 89 | Temp 97.5°F | Ht 63.0 in | Wt 241.1 lb

## 2023-10-27 DIAGNOSIS — J029 Acute pharyngitis, unspecified: Secondary | ICD-10-CM | POA: Diagnosis not present

## 2023-10-27 LAB — POC COVID19 BINAXNOW: SARS Coronavirus 2 Ag: NEGATIVE

## 2023-10-27 LAB — POCT RAPID STREP A (OFFICE): Rapid Strep A Screen: NEGATIVE

## 2023-10-27 LAB — POCT INFLUENZA A/B
Influenza A, POC: NEGATIVE
Influenza B, POC: NEGATIVE

## 2023-10-27 NOTE — Progress Notes (Signed)
 Patient ID: Sonya Santiago, female    DOB: February 08, 1996, 28 y.o.   MRN: [id number]  Chief Complaint  Patient presents with   Sinus Problem    Pt c/o sore throat, fever of 102.0, body aches and bilateral ear pain. Pt states she had a tick on her Saturday. Has tried sudafed, robitussin and numbing cough drops which did not help.          Discussed the use of AI scribe software for clinical note transcription with the patient, who gave verbal consent to proceed.  History of Present Illness The patient presents with a recent tick bite on her stomach, which was removed by her husband. She did not experience a rash, but did have itching at the site. She also reports a sudden onset of symptoms including a sore throat, headache, and fever of 102 degrees that started the previous night. She has been taking Sudafed and Robitussin to manage symptoms. The patient also mentions a history of asthma, but has not had any issues or needed a rescue inhaler in a long time. She has a dry cough, but no mucus production or shortness of breath. The patient also has a four-month-old baby at home and is concerned about potential exposure.  Assessment & Plan Viral Upper Respiratory Infection - Symptoms consistent with viral infection, including sore throat, fever, headache, and dry cough. Fever suggests infectious etiology. - Continue Sudafed for nasal congestion, limit to one week. - Continue Robitussin for cough. - Take ibuprofen  for inflammation, sore throat, and fever, up to three tablets twice or three times daily after eating. - Contact office if symptoms persist or worsen by Monday.  Tick Bite - Recent tick bite on her abdomen, pinpoint red mark noted, with no rash. Provided signs of tick-borne diseases. - Monitor for rash or worsening symptoms of tick-borne illness. - Seek medical attention if symptoms develop.   Subjective:    Outpatient Medications Prior to Visit  Medication Sig Dispense Refill    metFORMIN (GLUCOPHAGE) 500 MG tablet Take 500 mg by mouth 2 (two) times daily with a meal. (Patient not taking: Reported on 10/27/2023)     oxyCODONE (OXY IR/ROXICODONE) 5 MG immediate release tablet Take 1-2 tablets (5-10 mg total) by mouth every 4 (four) hours as needed for moderate pain (pain score 4-6). (Patient not taking: Reported on 10/27/2023) 30 tablet 0   No facility-administered medications prior to visit.   Past Medical History:  Diagnosis Date   Allergy    Anxiety    GERD (gastroesophageal reflux disease)    MDD (major depressive disorder) 01/24/2020   OD (overdose of drug), intentional self-harm, initial encounter (HCC) 01/26/2020   Pre-diabetes    Past Surgical History:  Procedure Laterality Date   CESAREAN SECTION N/A 06/21/2023   Procedure: CESAREAN SECTION;  Surgeon: Olivia Mackie, MD;  Location: [facility];  Service: Obstetrics;  Laterality: N/A;   TONSILLECTOMY AND ADENOIDECTOMY     WISDOM TOOTH EXTRACTION     No Known Allergies    Objective:    Physical Exam Vitals and nursing note reviewed.  Constitutional:      Appearance: Normal appearance. She is ill-appearing.     Interventions: Face mask in place.  HENT:     Right Ear: Tympanic membrane and ear canal normal.     Left Ear: Tympanic membrane and ear canal normal.     Nose:     Right Sinus: Frontal sinus tenderness present.     Left Sinus:  Frontal sinus tenderness present.     Mouth/Throat:     Mouth: Mucous membranes are moist.     Pharynx: Posterior oropharyngeal erythema present. No pharyngeal swelling, oropharyngeal exudate or uvula swelling.     Tonsils: No tonsillar exudate or tonsillar abscesses. 0 on the right. 0 on the left.  Cardiovascular:     Rate and Rhythm: Normal rate and regular rhythm.  Pulmonary:     Effort: Pulmonary effort is normal.     Breath sounds: Normal breath sounds.  Musculoskeletal:        General: Normal range of motion.  Lymphadenopathy:     Head:     Right side of  head: No preauricular or posterior auricular adenopathy.     Left side of head: No preauricular or posterior auricular adenopathy.     Cervical: No cervical adenopathy.  Skin:    General: Skin is warm and dry.     Findings: Lesion (middle abdomen, pinpoint red mark, no rash or drainage) present.  Neurological:     Mental Status: She is alert.  Psychiatric:        Mood and Affect: Mood normal.        Behavior: Behavior normal.    BP 108/76 (BP Location: Left Arm, Patient Position: Sitting, Cuff Size: Large)   Pulse 89   Temp (!) 97.5 F (36.4 C) (Temporal)   Ht 5\' 3"  (1.6 m)   Wt 241 lb 2 oz (109.4 kg)   SpO2 97%   Breastfeeding No   BMI 42.71 kg/m  Wt Readings from Last 3 Encounters:  10/27/23 241 lb 2 oz (109.4 kg)  06/20/23 235 lb 9.6 oz (106.9 kg)  03/12/23 224 lb 3.2 oz (101.7 kg)       Dulce Sellar, NP

## 2023-10-31 ENCOUNTER — Encounter: Payer: Self-pay | Admitting: Family

## 2023-10-31 DIAGNOSIS — R053 Chronic cough: Secondary | ICD-10-CM

## 2023-10-31 MED ORDER — AZITHROMYCIN 250 MG PO TABS: ORAL_TABLET | ORAL | 0 refills | Status: AC

## 2023-10-31 NOTE — Telephone Encounter (Signed)
 Sent in an antibiotic (azithromycin)
